# Patient Record
Sex: Female | Born: 1960 | Race: Black or African American | Hispanic: No | Smoking: Never smoker
Health system: Southern US, Community
[De-identification: ages and names within clinical notes are randomized; demographics above are authoritative.]

## PROBLEM LIST (undated history)

## (undated) DIAGNOSIS — E7800 Pure hypercholesterolemia, unspecified: Secondary | ICD-10-CM

## (undated) DIAGNOSIS — M47812 Spondylosis without myelopathy or radiculopathy, cervical region: Secondary | ICD-10-CM

## (undated) DIAGNOSIS — R569 Unspecified convulsions: Secondary | ICD-10-CM

## (undated) HISTORY — PX: HERNIA REPAIR: SHX51

## (undated) HISTORY — PX: ABDOMINAL HYSTERECTOMY: SHX81

## (undated) HISTORY — PX: CHOLECYSTECTOMY: SHX55

## (undated) HISTORY — PX: CARPAL TUNNEL RELEASE: SHX101

---

## 2004-05-30 ENCOUNTER — Encounter: Payer: Self-pay | Admitting: Gastroenterology

## 2004-07-11 ENCOUNTER — Encounter: Payer: Self-pay | Admitting: Gastroenterology

## 2006-10-27 ENCOUNTER — Encounter: Payer: Self-pay | Admitting: Gastroenterology

## 2006-11-18 ENCOUNTER — Ambulatory Visit (HOSPITAL_COMMUNITY): Admission: RE | Admit: 2006-11-18 | Discharge: 2006-11-18 | Payer: Self-pay | Admitting: Family Medicine

## 2007-04-29 ENCOUNTER — Ambulatory Visit: Payer: Self-pay | Admitting: Obstetrics & Gynecology

## 2007-05-06 ENCOUNTER — Ambulatory Visit (HOSPITAL_COMMUNITY): Admission: RE | Admit: 2007-05-06 | Discharge: 2007-05-06 | Payer: Self-pay | Admitting: Family Medicine

## 2007-05-15 ENCOUNTER — Encounter (INDEPENDENT_AMBULATORY_CARE_PROVIDER_SITE_OTHER): Payer: Self-pay | Admitting: *Deleted

## 2007-05-16 ENCOUNTER — Inpatient Hospital Stay (HOSPITAL_COMMUNITY): Admission: RE | Admit: 2007-05-16 | Discharge: 2007-05-17 | Payer: Self-pay | Admitting: Surgery

## 2007-05-22 ENCOUNTER — Emergency Department (HOSPITAL_COMMUNITY): Admission: EM | Admit: 2007-05-22 | Discharge: 2007-05-23 | Payer: Self-pay | Admitting: Emergency Medicine

## 2007-05-27 ENCOUNTER — Ambulatory Visit: Payer: Self-pay | Admitting: Obstetrics & Gynecology

## 2007-07-16 ENCOUNTER — Emergency Department (HOSPITAL_COMMUNITY): Admission: EM | Admit: 2007-07-16 | Discharge: 2007-07-16 | Payer: Self-pay | Admitting: Emergency Medicine

## 2007-07-19 ENCOUNTER — Emergency Department (HOSPITAL_COMMUNITY): Admission: EM | Admit: 2007-07-19 | Discharge: 2007-07-20 | Payer: Self-pay | Admitting: Emergency Medicine

## 2007-10-02 ENCOUNTER — Ambulatory Visit (HOSPITAL_BASED_OUTPATIENT_CLINIC_OR_DEPARTMENT_OTHER): Admission: RE | Admit: 2007-10-02 | Discharge: 2007-10-02 | Payer: Self-pay | Admitting: Orthopedic Surgery

## 2007-10-22 ENCOUNTER — Ambulatory Visit: Payer: Self-pay | Admitting: Obstetrics & Gynecology

## 2007-10-24 ENCOUNTER — Emergency Department (HOSPITAL_COMMUNITY): Admission: EM | Admit: 2007-10-24 | Discharge: 2007-10-24 | Payer: Self-pay | Admitting: *Deleted

## 2007-11-03 ENCOUNTER — Ambulatory Visit (HOSPITAL_COMMUNITY): Admission: RE | Admit: 2007-11-03 | Discharge: 2007-11-03 | Payer: Self-pay | Admitting: Obstetrics & Gynecology

## 2007-11-25 ENCOUNTER — Ambulatory Visit: Payer: Self-pay | Admitting: Obstetrics and Gynecology

## 2007-11-26 ENCOUNTER — Encounter: Admission: RE | Admit: 2007-11-26 | Discharge: 2007-12-10 | Payer: Self-pay | Admitting: Neurology

## 2008-04-06 ENCOUNTER — Emergency Department (HOSPITAL_COMMUNITY): Admission: EM | Admit: 2008-04-06 | Discharge: 2008-04-07 | Payer: Self-pay | Admitting: Emergency Medicine

## 2008-05-10 ENCOUNTER — Ambulatory Visit: Payer: Self-pay | Admitting: Gastroenterology

## 2008-05-10 DIAGNOSIS — K219 Gastro-esophageal reflux disease without esophagitis: Secondary | ICD-10-CM

## 2008-05-10 DIAGNOSIS — D509 Iron deficiency anemia, unspecified: Secondary | ICD-10-CM

## 2009-01-27 ENCOUNTER — Emergency Department (HOSPITAL_COMMUNITY): Admission: EM | Admit: 2009-01-27 | Discharge: 2009-01-27 | Payer: Self-pay | Admitting: Emergency Medicine

## 2009-05-08 ENCOUNTER — Encounter: Admission: RE | Admit: 2009-05-08 | Discharge: 2009-05-08 | Payer: Self-pay | Admitting: Obstetrics & Gynecology

## 2009-06-28 ENCOUNTER — Telehealth: Payer: Self-pay | Admitting: Gastroenterology

## 2009-07-06 ENCOUNTER — Encounter (INDEPENDENT_AMBULATORY_CARE_PROVIDER_SITE_OTHER): Payer: Self-pay | Admitting: *Deleted

## 2009-07-06 ENCOUNTER — Ambulatory Visit (HOSPITAL_COMMUNITY): Admission: RE | Admit: 2009-07-06 | Discharge: 2009-07-06 | Payer: Self-pay | Admitting: Obstetrics & Gynecology

## 2009-08-10 ENCOUNTER — Ambulatory Visit: Payer: Self-pay | Admitting: Gastroenterology

## 2009-08-29 ENCOUNTER — Telehealth: Payer: Self-pay | Admitting: Gastroenterology

## 2009-10-11 ENCOUNTER — Ambulatory Visit: Payer: Self-pay | Admitting: Gastroenterology

## 2009-10-11 DIAGNOSIS — R110 Nausea: Secondary | ICD-10-CM

## 2009-10-11 DIAGNOSIS — K5900 Constipation, unspecified: Secondary | ICD-10-CM | POA: Insufficient documentation

## 2009-10-17 ENCOUNTER — Telehealth (INDEPENDENT_AMBULATORY_CARE_PROVIDER_SITE_OTHER): Payer: Self-pay | Admitting: *Deleted

## 2009-10-23 ENCOUNTER — Encounter (INDEPENDENT_AMBULATORY_CARE_PROVIDER_SITE_OTHER): Payer: Self-pay

## 2010-04-01 ENCOUNTER — Encounter: Payer: Self-pay | Admitting: Internal Medicine

## 2010-04-02 ENCOUNTER — Encounter: Payer: Self-pay | Admitting: *Deleted

## 2010-04-10 NOTE — Progress Notes (Signed)
Summary: refills  Phone Note Call from Patient Call back at Home Phone [phone]   Caller: Patient Call For: Russella Dar Reason for Call: Refill Medication Summary of Call: Patient would like refill for her Nexium until her appt day 6-2. Jordan Hawks at [facility] [address]) Initial call taken by: Tawni Levy,  June 28, 2009 9:07 AM  Follow-up for Phone Call        Rx was sent to pts pharmacy and pt told to keep her appt for any further refills.  Follow-up by: Christie Nottingham CMA Duncan Dull),  June 28, 2009 9:14 AM    Prescriptions: NEXIUM 40 MG CPDR (ESOMEPRAZOLE MAGNESIUM) one tablet by mouth once daily...MUST HAVE OFFICE VISIT BEFORE MORE REFILLS  #30 x 1   Entered by:   Christie Nottingham CMA (AAMA)   Authorized by:   Meryl Dare MD [facility]   Signed by:   Christie Nottingham CMA (AAMA) on 06/28/2009   Method used:   [facility] to        [facility] (retail)       [address]       [place], [place]  [address]       Ph: [phone]       Fax: [phone]   RxID:   [id number]

## 2010-04-10 NOTE — Miscellaneous (Signed)
Summary: Recall colon  Clinical Lists Changes  Observations: Added new observation of COLONNXTDUE: 06/2012 (10/23/2009 9:36)

## 2010-04-10 NOTE — Progress Notes (Signed)
Summary: Nexium samples  Phone Note Call from Patient Call back at Home Phone [phone]   Caller: Patient Call For: Dr. Russella Dar Reason for Call: Talk to Nurse Summary of Call: pt has run out of Nexium and will not be seen in office until August to renew rx... would like samples Initial call taken by: Vallarie Mare,  August 29, 2009 9:39 AM  Follow-up for Phone Call        Left a message for regarding her Nexium samples and upcoming appt. Told pt that we cannot give her any samples or a refill until her upcoming appt in August b/c she no showed her last appt with [place] in June. I told her when I spoke with her in April she could not have any more refills until she came for her appt and she did not come for her appt in June nor did she call to reschedule her appt.  Follow-up by: Christie Nottingham CMA Duncan Dull),  August 29, 2009 10:24 AM

## 2010-04-10 NOTE — Procedures (Signed)
Summary: Colonoscopy/[facility]   Imported By: Sherian Rein 10/24/2009 14:53:53  _____________________________________________________________________  External Attachment:    Type:   Image     Comment:   External Document

## 2010-04-10 NOTE — Op Note (Signed)
Summary: Laparoscopy with lysis of adhesions    NAME:  Julie Fuller, Julie Fuller               ACCOUNT NO.:  [id number]      MEDICAL RECORD NO.:  [id number]          PATIENT TYPE:  AMB      LOCATION:  SDC                           [facility]:  [facility]      PHYSICIAN:  Darryl Nestle, MD     DATE OF BIRTH:  1960-07-26      DATE OF PROCEDURE:  07/06/2009   DATE OF DISCHARGE:                                  OPERATIVE REPORT      PREOPERATIVE DIAGNOSES:  Left adnexal mass, possible left ovarian cyst,   pelvic pain, and dyspareunia.      POSTOPERATIVE DIAGNOSES:  Pelvic adhesions.  No evidence of ovarian cyst   or adnexal cyst.      PROCEDURES:  Laparoscopy with lysis of adhesions in the pelvis, removal   of metal clips that were possibly used for her hernia mesh placement   that were noted in the lower pelvic area.      ANESTHESIA:  General endotracheal.      IV FLUIDS:  1700 mL LR.      URINE OUTPUT:  200 mL clear.      ESTIMATED BLOOD LOSS:  Minimal.      SURGEON:  Darryl Nestle, MD      ASSISTANT:  Genia Del, MD      COMPLICATIONS:  None.      PATHOLOGY:  None.      DISPOSITION:  The patient is stable, extubated, and to recovery room.      PROCEDURE IN DETAILS:  The patient is a 50 year old woman with left   pelvic pain intermittent nonradiating in the left lower quadrant.  She   has prior multiple abdominal surgeries and including hysterectomy.   Ultrasound and CT scan noted a 7-cm cyst in the left adnexa, suspected   left ovarian cyst.  The pain was, however, higher up in the lower   quadrant for it to be ovarian. Patient was seen by Gen surgeon to rule out    hernia related pain.  However due to high index of suspicion,   decision was made to proceed with laparoscopy diagnostic and possible   removal of ovarian cyst or ovary.  Risks and complications of surgery   including infection, bleeding, damage to surrounding structures   including ureters, bladder, bowel were  reviewed, risk of come off   laparotomy was reviewed.  The patient was understanding.  Informed   written consent was obtained.  She was brought to the operating room   with IV running.  She underwent general endotracheal anesthesia without   difficulty.  Then she was given dorsal lithotomy position, parts were   prepped and draped.  Foley catheter was placed and a sponge stick was   put in the vagina for manipulation.  Initially, an attempt was made to   enter the peritoneal cavity with open laparoscopy and Hasson cannula   through her umbilicus.  A 1-cm incision was made above her umbilicus,   but on palpation it was difficult to  see and feel the fascia and there was a lot   of thickened tissue palpated through the incision.  Hence, the decision   was made to proceed with left upper quadrant entry.  The patient was   given OG tube to empty her stomach contents.  A 5-mm incision was made   in the left upper quadrant about 3 cm above the line of the umbilicus   and in the midclavicular line.  The skin was lifted up with Allis clamps   and a long Veress needle was used.  Placement was confirmed with saline   drop.  A pneumoperitoneum was begun to obtain a pressure of 14 mm.   Veress was removed and while tenting the abdominal wall, again a long   trocar was used and a OptiVu technique was used to introduce the trocar   well visualizing its pass.  Once the peritoneum entry was confirmed, the   cannula was left in place, trocar was removed and the laparoscope was   reintroduced.  A 5-mm  0-degree laparoscope was used.  The patient was   then given Trendelenburg position.  There was difficult to see the   vaginal cuff due to bowel in the pelvic area.  There was no evidence of   adhesion in the lower abdomen along the anterior abdominal wall and   therefore decision was made to place 2 lateral ports in the lower right   and lower left quadrant under direct visualization, 5-mm incisions were    made and 5-mm trocar cannula was introduced without difficulty.  A blunt   probe and a grasper were introduced through each of these and bowel was   moved out of the pelvis.  Left adnexa and right adnexa were evaluated.   Left adnexa showed no evidence of cyst.  There was adhesions between the   left ovary and left tube and the vaginal wall as well as left ovary and   bowel, but there was no evidence of ovarian cyst or paratubal cyst.  The   patient was then made to free these adhesions up with monopolar scissors   with and without cautery, adhesiolysis was performed initially of the   ovary from the bowel and then the fimbria from the vaginal wall.  It was   also noted that in the right lower quadrant above the bladder and over   the vaginal cuff, there were metal coils were noted which were identical   to the ones that were used to incur hernia mesh.  These were removed by   grasping and pulling them out of the lower ports.  Survey of the rest of   the abdomen showed that there was some other bowel adhesions on the left   side, but it did not look like it was changing the axis of peristalsis   in the left lower quadrant.  In the right lower quadrant, the right   adnexa and bowels looked normal.  Both the tube showed evidence of small   paratubal cyst less than 1 cm in size and these did not need to be   removed.  The survey of upper abdomen by her umbilical hernia repairshowed presence of mesh.  There was also evidence of coils  that were   holding the mesh in place.  The bowel (large bowel) was noted to be   adherent to anterior abdominal wall under the umbilicus, but this was   not freed up due to dense adhesions.  Also the patient's pain is not   periumbilical and therefore probably not related to bowel peristalsis   axis.  Liver was noted to be normal.  The patient had prior   cholecystectomy.  Rest of the upper abdomen, right and left upper   quadrants were normal.  The trocars were  removed under visualization   after performing irrigation.  There was no active bleeding.  The   pneumoperitoneum was deflated.  At the umbilicus, a 0 Vicryl stitch was   taken in the scar tissue under the skin incision and skin was   approximated with 3-0 Vicryl and the three 5-mm ports entry skin   incisions were approximated with Dermabond.  The Foley catheter and   vaginal sponge stick were removed.  The patient was made supine.  She   was reversed from anesthesia and brought to the recovery room in stable   condition.  Photograph were taken for patient and will be discussed at a   followup visit.      The patient will be discharged home from recovery room and follow up   with me in 2 weeks.               Darryl Nestle, MD            VM/MEDQ  D:  07/06/2009  T:  07/06/2009  Job:  [id number]      Electronically Signed by Susa Day MODY  on 07/13/2009 11:20:11 AM

## 2010-04-10 NOTE — Procedures (Signed)
Summary: EGD/[facility]  EGD/[facility]   Imported By: Sherian Rein 10/24/2009 14:52:50  _____________________________________________________________________  External Attachment:    Type:   Image     Comment:   External Document

## 2010-04-10 NOTE — Assessment & Plan Note (Signed)
Summary: f/u and med refill--ch.   History of Present Illness Visit Type: Follow-up Visit Primary GI MD: Elie Goody MD [facility] Primary Provider: Elpidio Anis, PA  Requesting Provider: na Chief Complaint: F/u for GERD and pt c/o nausea History of Present Illness:   Mrs. Julie Fuller returns for followup of GERD which is under very good control on Nexium 40 mg daily. She complains of ongoing problems with constant nausea which does not change with meals or time of day. She also has ongoing constipation, which has improved with the addition of fiber supplements. We had not received prior records from endoscopy and colonoscopy in [place], [place].   GI Review of Systems    Reports nausea.      Denies abdominal pain, acid reflux, belching, bloating, chest pain, dysphagia with liquids, dysphagia with solids, heartburn, loss of appetite, vomiting, vomiting blood, weight loss, and  weight gain.      Reports constipation.     Denies anal fissure, black tarry stools, change in bowel habit, diarrhea, diverticulosis, fecal incontinence, heme positive stool, hemorrhoids, irritable bowel syndrome, jaundice, light color stool, liver problems, rectal bleeding, and  rectal pain.   Current Medications (verified): 1)  Alprazolam 0.5 Mg Tbdp (Alprazolam) .... Take 1/2-1 Tablet By Mouth As Needed 2)  Topiramate 100 Mg Tabs (Topiramate) .... Take 3 Tablets By Mouth At Bedtime 3)  Nexium 40 Mg Cpdr (Esomeprazole Magnesium) .... One Tablet By Mouth Once Daily 4)  Nortriptyline Hcl 25 Mg Caps (Nortriptyline Hcl) .... Four Tablets By Mouth At Bedtime 5)  Flexeril 10 Mg Tabs (Cyclobenzaprine Hcl) .... One Every Six Hours As Needed For Pain 6)  Pravastatin Sodium 20 Mg Tabs (Pravastatin Sodium) .... One Tablet By Mouth Once Daily 7)  Mobic 15 Mg Tabs (Meloxicam) .... One Tablet By Mouth Once Daily 8)  Ventolin Hfa 108 (90 Base) Mcg/act Aers (Albuterol Sulfate) .... Two Times A Day As Needed  Allergies (verified): 1)   ! Dilantin 2)  ! Erythromycin  Past History:  Past Medical History: Seizure disorder Fe deficiency anemia Anxiety Disorder Arthritis Asthma Chronic Headaches Depression GERD, 1986 Hiatal hernia  Hyperlipidemia Kidney Stones Obesity Pneumonia Urinary Tract Infection  Past Surgical History: Cholecystectomy, 1994 Umbilical hernia repairs, 1994, 1999 Ventral hernia repair, 05/2007 Hysterectomy, 1999 carpal tunnel release-bilateral bunionectomy-bilateral pin removal-left foot Laparoscopy, pelvic adhesion lysis, 2011  Family History: Reviewed history from 05/10/2008 and no changes required. No FH of Colon Cancer: Family History of Diabetes: Mother, Sister x 2 Family History of Heart Disease: Father Family History of Irritable Bowel Syndrome: Sister Family History of Liver Disease/Cirrhosis: Brother (alcoholic cirrhosis)  Social History: [employer] [place] [employer]  Patient has never smoked.  Alcohol Use - no Illicit Drug Use - no Patient gets regular exercise.  Review of Systems       The pertinent positives and negatives are noted as above and in the HPI. All other ROS were reviewed and were negative.   Vital Signs:  Patient profile:   50 year old female Height:      69 inches Weight:      268 pounds BMI:     39.72 BSA:     2.34 Pulse rate:   88 / minute Pulse rhythm:   regular BP sitting:   122 / 76  (left arm) Cuff size:   large  Vitals Entered By: Ok Anis CMA (October 11, 2009 9:40 AM)  Physical Exam  General:  Well developed, well nourished, no acute distress. obese.  Head:  Normocephalic and atraumatic. Eyes:  PERRLA, no icterus. Mouth:  No deformity or lesions, dentition normal. Lungs:  Clear throughout to auscultation. Heart:  Regular rate and rhythm; no murmurs, rubs,  or bruits. Abdomen:  Soft, nontender and nondistended. No masses, hepatosplenomegaly or hernias noted. Normal bowel sounds. Psych:  Alert and cooperative. Normal mood and  affect.  Impression & Recommendations:  Problem # 1:  GERD (ICD-530.81) Continue Nexium 40 mg q.a.m. and standard antireflux measures. Request records from prior endoscopy.  Problem # 2:  CONSTIPATION (ICD-564.00) Chronic constipation. Request records from prior colonoscopy. Daily fiber supplements and a long-term high fiber diet with increased daily water intake.  Problem # 3:  NAUSEA (ICD-787.02) Chronic nausea that does not vary with meals or time of day. Medication side effects, and nongastrointestinal causes need to be further evaluated. She will discuss with her PCP about actually modifying her medication regimen.  Patient Instructions: 1)  Nexium has been sent to your pharmacy.  2)  High Fiber, Low Fat  Healthy Eating Plan brochure given.  3)  Please continue current medications.  4)  Please schedule a follow-up appointment in 1 year. 5)  Copy sent to : Elpidio Anis, PA 6)  The medication list was reviewed and reconciled.  All changed / newly prescribed medications were explained.  A complete medication list was provided to the patient / caregiver.  Prescriptions: NEXIUM 40 MG CPDR (ESOMEPRAZOLE MAGNESIUM) one tablet by mouth once daily  #30 x 11   Entered by:   Christie Nottingham CMA (AAMA)   Authorized by:   Meryl Dare MD [facility]   Signed by:   Meryl Dare MD [facility] on 10/11/2009   Method used:   Electronically to        [facility] (retail)       [address]       [place], [place]  [address]       Ph: [phone]       Fax: [phone]   RxID:   [id number]

## 2010-04-10 NOTE — Progress Notes (Signed)
  Phone Note Other Incoming   Request: Send information Action Taken: Information Sent Summary of Call: Records received from [facility]. 46 pages forwarded to Dr. Russella Dar for review.

## 2010-05-29 LAB — CBC
HCT: 37.3 % (ref 36.0–46.0)
MCHC: 33.6 g/dL (ref 30.0–36.0)
RBC: 4.51 MIL/uL (ref 3.87–5.11)
RDW: 14.6 % (ref 11.5–15.5)
WBC: 5.2 10*3/uL (ref 4.0–10.5)

## 2010-07-24 NOTE — Group Therapy Note (Signed)
NAME:  Julie Fuller, Julie Fuller NO.:  [id number]   MEDICAL RECORD NO.:  [id number]          PATIENT TYPE:  WOC   LOCATION:  [facility]                   FACILITY:  [facility]   PHYSICIAN:  Elsie Lincoln, MD      DATE OF BIRTH:  December 08, 1960   DATE OF SERVICE:  10/22/2007                                  CLINIC NOTE   The patient is a 50 year old female, The patient presents with burning  at the urethra continuously and right mid quadrant pain while laying on  the right side.  Of note, the patient has had three hernia repairs.  There could be some adhesions with this.  She also had hysterectomy, but  does have her ovaries.  She has a complaint of dysuria, but mostly just  burning from the urethra at all times.  She denies any discharge.  She  is sexually active.   PHYSICAL EXAMINATION:  BACK:  No CVA tenderness.  ABDOMEN:  Obese.  Well-healed incisions from hernia repairs.  Nontender,  nondistended.  No rebound or guarding.  GENITALIA:  Tanner V.  Vagina pink, vault intact, small cystocele.  No  tenderness over urethra or bladder.  Vulva is burning at the urethra,  but no vestibular burning or pain.   ASSESSMENT/PLAN:  A 50 year old female with burning of the urethra and  right sided pain.   1. Urinalysis.  2. Urine culture.  3. Transvaginal ultrasound.  4. The patient wants to be tested for hep and hep B.  She has been      tested for HIV and syphilis, which were negative.  The patient will      come back in 2-3 weeks for results.           ______________________________  Elsie Lincoln, MD     KL/MEDQ  D:  10/22/2007  T:  10/22/2007  Job:  [id number]

## 2010-07-24 NOTE — Op Note (Signed)
NAME:  Julie Fuller, Julie Fuller NO.:  [id number]   MEDICAL RECORD NO.:  [id number]          PATIENT TYPE:  INP   LOCATION:  1611                         FACILITY:  [facility]   PHYSICIAN:  Wilmon Arms. Corliss Skains, M.D. DATE OF BIRTH:  1960-04-20   DATE OF PROCEDURE:  05/15/2007  DATE OF DISCHARGE:  05/17/2007                               OPERATIVE REPORT   PREOPERATIVE DIAGNOSIS:  Supraumbilical ventral hernia.   POSTOPERATIVE DIAGNOSIS:  Supraumbilical ventral hernia.   PROCEDURE PERFORMED:  Laparoscopic ventral hernia repair with mesh.   SURGEON:  Wilmon Arms. Corliss Skains, M.D., FACS   ANESTHESIA:  General endotracheal.   INDICATIONS:  The patient is a 50 year old female who has had two  previous umbilical hernia repairs, the second one with mesh.  Over the  last year and a half she has developed a bulge in her epigastric region.  A CT scan last year confirmed that she had a ventral hernia containing  fat.  In the intervening time this has become much larger and she has  had a lot of discomfort.  The hernia is not reducible.   DESCRIPTION OF PROCEDURE:  The patient was brought to the operating room  and placed in the supine position on the operating room table.  After an  adequate level of general anesthesia was obtained, a Foley catheter was  placed under sterile technique.  The patient's abdomen was prepped with  Betadine and draped in sterile fashion.  Time-out was taken to assure  proper patient and proper procedure.  We anesthetized an area below the  left costal margin and anterior axillary line with 25% Marcaine.  A 5 mm  incision was made here.  I attempted to use a 5 mm 75 mm length OptiVu  trocar to cannulate the peritoneal cavity.  However, the patient's body  habitus prevented [place] from actually reaching the peritoneal cavity.  We  attempted using a 100 mm length 5 mm trocar and we were still unable to  safely cannulate the peritoneal cavity.  Therefore at the level of the  umbilicus in the anterior axillary line, we made an 11 mm transverse  incision and entered the peritoneal cavity using a open Hassan  technique.  A stay suture of 0 Vicryl was placed around the fascial  opening.  The Hasson cannula was inserted and secured with a stay  suture.  Pneumoperitoneum was obtained by insufflating CO2 maintaining  maximal pressure of 15 mmHg.  The laparoscope was inserted and we  examined the left upper quadrant where we had attempted to place the  Optiview trocar.  There were no defects in the peritoneum, so we never  actually reached peritoneal cavity.  There was no sign of any bleeding  or hematoma.  Now, under direct vision, we were able to place the 100 mm  5 mm trocar into the peritoneal cavity.  Another 5 mm trocar was placed  in the left lower quadrant.  There was a large amount of omentum that  was herniated up into the hernia sac.  There were also some adhesions  from the patient's previous  hernia repair.  These adhesions were taken  down with the Harmonic scalpel.  We were able to reduce the large amount  of omentum back into the peritoneal space and take down all the  adhesions exposing the defect.  The defect was in the midline and  measured about 3 cm in diameter just at the upper edge of her previous  hernia repair.  We carefully inspected the previous hernia repair.  Some  of the sutures seemed to be rather tenuous.  Therefore the decision was  made to cover both the new defect as well as the old repair with a sheet  of Proceed mesh.  We selected a 15 x 20 cm sheet of Proceed.  Six stay  sutures of zero prior Prolene were placed around the edges of the mesh.  The mesh was rolled up and inserted through the Hasson cannula.  It was  unfurled.  The Endoclose device was used to pull up the stay sutures  through small stab incisions.  The mesh was then suspended from the  posterior surface of the anterior abdominal wall.  The stay sutures were  then  tied down.  The ProTack device was then used to place a ring of  tacks circumferentially around the mesh.  We placed an additional 5 mm  trocar on the patient's right side to allow proper visualization of the  tack placement.  Several random tacks were placed inside the outer ring  of ProTacks.  The mesh did not appear to be under any undue tension.  There was no redundancy and all edges of the mesh seemed to be tightly  attached to the anterior abdominal wall.  We then removed the Hasson  cannula and closed the fascia with zero Vicryl stay suture.  Pneumoperitoneum was then released as the trocars were removed.  The  wounds were closed with deep dermal sutures of 4-0 Monocryl.  Dermabond  was used to close the skin.  A Foley catheter was removed.  The patient  was then extubated and brought to recovery in stable condition.  All  sponge, instrument and needle counts were correct.      Wilmon Arms. Tsuei, M.D.  Electronically Signed     MKT/MEDQ  D:  05/15/2007  T:  05/17/2007  Job:  [id number]

## 2010-07-24 NOTE — Op Note (Signed)
NAME:  Julie Fuller, Julie Fuller               ACCOUNT NO.:  [id number]   MEDICAL RECORD NO.:  [id number]          PATIENT TYPE:  AMB   LOCATION:  DSC                          FACILITY:  [facility]   PHYSICIAN:  Cindee Salt, M.D.       DATE OF BIRTH:  26-Jul-1960   DATE OF PROCEDURE:  10/02/2007  DATE OF DISCHARGE:  05/06/2007                               OPERATIVE REPORT   PREOPERATIVE DIAGNOSIS:  Carpal tunnel syndrome left hand.   POSTOPERATIVE DIAGNOSIS:  Carpal tunnel syndrome left hand.   OPERATION:  Decompression left median nerve.   SURGEON:  Cindee Salt, MD   ANESTHESIA:  Forearm based IV regional.   ANESTHESIOLOGIST:  W. Autumn Patty, MD   HISTORY:  The patient is a 50 year old female with a history of carpal  tunnel syndrome.  EMG nerve conduction is positive.  This has not  responded to conservative treatment.  She has elected to undergo  decompression of the median nerve.  Preoperative, perioperative, and  postoperative course have been discussed along with the risks and  complications.  She is aware that there is no guarantee with the  surgery, possibility of infection, recurrence, injury to arteries,  nerves, tendons, incomplete relief of symptoms, and dystrophy.  The  preoperative area, the patient is seen.  The extremity marked by both  the patient and surgeon.  Antibiotic given.   PROCEDURE:  The patient is brought to the operating room, where a  forearm based IV regional anesthetic was carried out without difficulty.  She was prepped using DuraPrep in the supine position with the left arm  free.  A time-out was taken.  A longitudinal incision was made in the  palm and carried down through the subcutaneous tissue.  Bleeders were  electrocauterized.  Palmar fascia was split.  Superficial palmar arch  identified.  The flexor tendon to the ring little finger identified to  the ulnar side of the median nerve, a carpal retinaculum was incised  with sharp dissection.  A right  angle and Sewall retractor were placed  between the skin and the forearm fascia.  The fascia was released for  approximately 1.5 centimeter proximal to the wrist crease under direct  vision.  The canal was explored.  Area of compression to the nerve was  apparent.  No further lesions were identified.  The wound was irrigated.  The skin was closed with interrupted 5-0 Vicryl Rapide sutures.  A  sterile compressive dressing and splint to the wrist was applied.  The  patient tolerated the procedure well and was taken to the recovery room  for observation in satisfactory condition.  She will be discharged home  to return to the [facility] of [place] in 1 week, on Vicodin.          ______________________________  Cindee Salt, M.D.    GK/MEDQ  D:  10/02/2007  T:  10/03/2007  Job:  [id number]   cc:   Dr. Anne Hahn

## 2010-07-24 NOTE — Discharge Summary (Signed)
NAME:  SKILA, ROLLINS NO.:  [id number]   MEDICAL RECORD NO.:  [id number]          PATIENT TYPE:  WOC   LOCATION:  WOC                          FACILITY:  [facility]   PHYSICIAN:  Lesly Dukes, M.D. DATE OF BIRTH:  06-14-1960   DATE OF ADMISSION:  04/29/2007  DATE OF DISCHARGE:                               DISCHARGE SUMMARY   HISTORY OF PRESENT ILLNESS:  This is a 50 year old female with a history  of hysterectomy and two umbilical hernia repairs with a long history of  urinary problems.  She comes to clinic complaining of a six to seven-  month history of increase in urinary frequency, lower abdominal pressure  and incomplete bladder emptying sensation.  Approximately at the same  time, she has had some vaginal bleeding after intercourse with pink  froth noted on toilet paper approximately 50% of the time with  intercourse approximately one time per week.  She denies dysuria,  hematuria.  She does state she has a clear vaginal discharge for the  past few months with no smell.  She denies fever, chills, or vomiting.  She does complain of an occasional nausea which is secondary to a  ventral hernia.   PAST MEDICAL HISTORY:  1. Migraines.  2. Seizures.  She has been off medications for seven years.  She was      on Depakote.  3. Anemia.  4. Lower back pain.  5. Knee arthritis.  6. Ventral hernia.  7. Hiatal hernia.   PAST SURGICAL HISTORY:  1. Hysterectomy in 2004.  2. Cystoscopy in 2004.  3. Cholecystectomy in 2000.  4. Two umbilical hernia repairs, both in 2000.  5. She had a bunionectomy in 2007.   FAMILY HISTORY:  She has a family history of sickle-cell.  Her son is  deceased at age 11 of malignant melanoma.  Her mother and both of her  sisters are non-insulin dependent diabetics, and her father has coronary  artery disease.   SOCIAL HISTORY:  Denies  tobacco, alcohol or drugs.   MEDICATIONS:  1. Topamax.  2. Relafen.  3. Mobic.  4.  Albuterol.  5. Flexeril.   OB HISTORY:  She is a P3, G3.  She has had abnormal Pap smears in the  past, none since her hysterectomy.  She was treated for bacterial  vaginosis with antibiotics in 2007.  She has never needed any procedures  to treat her abnormal Pap smears.   PHYSICAL EXAMINATION:  HEENT:  Pupils were equal, round, reactive to  light.  EOMI.  The oropharynx is clear.  CARDIAC:  Regular rate and rhythm.  No gallops, rubs or murmurs.  LUNGS:  Clear to auscultation bilaterally.  ABDOMEN:  Obese, positive bowel sounds, soft, no organomegaly, moderate-  sized reducible ventral hernia.  PELVIC EXAM:  No cervix, no adnexal tenderness.  There is a cystocele  present.  It is a well-compensated vaginal musculature wall.  EXTREMITIES:  No edema.   Samples were taken for GC, chlamydia as well as wet prep.  Post void  residual was 20 ml.   ASSESSMENT/PLAN:  This  is a 50 year old woman with a long history of  urinary problems with a six to seven-month history of increase in  dysfunction.  We will be getting her old pathology reports from her  hysterectomy, doing a GC and chlamydia, doing urinalysis.  We will be  sending a urology consult.     ______________________________  Dory Peru, M.D.  Electronically Signed    FH/MEDQ  D:  04/29/2007  T:  04/30/2007  Job:  [id number]

## 2010-09-02 ENCOUNTER — Emergency Department (HOSPITAL_COMMUNITY)
Admission: EM | Admit: 2010-09-02 | Discharge: 2010-09-02 | Disposition: A | Discharge status: Home / Self Care | Payer: PRIVATE HEALTH INSURANCE | Attending: Emergency Medicine | Admitting: Emergency Medicine

## 2010-09-02 DIAGNOSIS — K5289 Other specified noninfective gastroenteritis and colitis: Secondary | ICD-10-CM | POA: Insufficient documentation

## 2010-09-02 DIAGNOSIS — J45909 Unspecified asthma, uncomplicated: Secondary | ICD-10-CM | POA: Insufficient documentation

## 2010-09-02 DIAGNOSIS — Z87442 Personal history of urinary calculi: Secondary | ICD-10-CM | POA: Insufficient documentation

## 2010-09-02 DIAGNOSIS — R51 Headache: Secondary | ICD-10-CM | POA: Insufficient documentation

## 2010-09-02 DIAGNOSIS — R197 Diarrhea, unspecified: Secondary | ICD-10-CM | POA: Insufficient documentation

## 2010-09-02 DIAGNOSIS — R112 Nausea with vomiting, unspecified: Secondary | ICD-10-CM | POA: Insufficient documentation

## 2010-09-02 LAB — COMPREHENSIVE METABOLIC PANEL
BUN: 8 mg/dL (ref 6–23)
CO2: 24 mEq/L (ref 19–32)
Calcium: 9.6 mg/dL (ref 8.4–10.5)
Chloride: 104 mEq/L (ref 96–112)
GFR calc Af Amer: >60 mL/min (ref >60)
Glucose, Bld: 112 mg/dL — ABNORMAL HIGH (ref 70–99)
Potassium: 3.4 mEq/L — ABNORMAL LOW (ref 3.5–5.1)
Total Bilirubin: 0.2 mg/dL — ABNORMAL LOW (ref 0.3–1.2)

## 2010-09-02 LAB — CBC
HCT: 41.1 % (ref 36.0–46.0)
Hemoglobin: 13.4 g/dL (ref 12.0–15.0)
MCV: 81.9 fL (ref 78.0–100.0)
WBC: 7.6 10*3/uL (ref 4.0–10.5)

## 2010-09-02 LAB — URINALYSIS, ROUTINE W REFLEX MICROSCOPIC
Glucose, UA: NEGATIVE mg/dL
Hgb urine dipstick: NEGATIVE
Nitrite: NEGATIVE
Protein, ur: NEGATIVE mg/dL
Urobilinogen, UA: 0.2 mg/dL (ref 0.0–1.0)

## 2010-09-02 LAB — DIFFERENTIAL
Basophils Relative: 0 % (ref 0–1)
Eosinophils Relative: 0 % (ref 0–5)
Lymphs Abs: 1.5 10*3/uL (ref 0.7–4.0)

## 2010-09-11 ENCOUNTER — Other Ambulatory Visit (HOSPITAL_COMMUNITY): Payer: Self-pay

## 2010-09-12 ENCOUNTER — Emergency Department (HOSPITAL_BASED_OUTPATIENT_CLINIC_OR_DEPARTMENT_OTHER)
Admission: EM | Admit: 2010-09-12 | Discharge: 2010-09-12 | Disposition: A | Discharge status: Home / Self Care | Payer: PRIVATE HEALTH INSURANCE | Attending: Emergency Medicine | Admitting: Emergency Medicine

## 2010-09-12 DIAGNOSIS — S61209A Unspecified open wound of unspecified finger without damage to nail, initial encounter: Secondary | ICD-10-CM | POA: Insufficient documentation

## 2010-09-12 DIAGNOSIS — Y92009 Unspecified place in unspecified non-institutional (private) residence as the place of occurrence of the external cause: Secondary | ICD-10-CM | POA: Insufficient documentation

## 2010-09-12 DIAGNOSIS — W268XXA Contact with other sharp object(s), not elsewhere classified, initial encounter: Secondary | ICD-10-CM | POA: Insufficient documentation

## 2010-09-12 DIAGNOSIS — J45909 Unspecified asthma, uncomplicated: Secondary | ICD-10-CM | POA: Insufficient documentation

## 2010-09-12 DIAGNOSIS — E785 Hyperlipidemia, unspecified: Secondary | ICD-10-CM | POA: Insufficient documentation

## 2010-09-12 DIAGNOSIS — Z79899 Other long term (current) drug therapy: Secondary | ICD-10-CM | POA: Insufficient documentation

## 2010-09-12 DIAGNOSIS — G8929 Other chronic pain: Secondary | ICD-10-CM | POA: Insufficient documentation

## 2010-11-19 ENCOUNTER — Encounter: Payer: Self-pay | Admitting: Student

## 2010-11-19 ENCOUNTER — Other Ambulatory Visit: Payer: Self-pay

## 2010-11-19 ENCOUNTER — Emergency Department (INDEPENDENT_AMBULATORY_CARE_PROVIDER_SITE_OTHER): Payer: PRIVATE HEALTH INSURANCE

## 2010-11-19 ENCOUNTER — Emergency Department (HOSPITAL_BASED_OUTPATIENT_CLINIC_OR_DEPARTMENT_OTHER)
Admission: EM | Admit: 2010-11-19 | Discharge: 2010-11-19 | Disposition: A | Discharge status: Home / Self Care | Payer: PRIVATE HEALTH INSURANCE | Attending: Emergency Medicine | Admitting: Emergency Medicine

## 2010-11-19 DIAGNOSIS — R079 Chest pain, unspecified: Secondary | ICD-10-CM | POA: Insufficient documentation

## 2010-11-19 DIAGNOSIS — E7800 Pure hypercholesterolemia, unspecified: Secondary | ICD-10-CM | POA: Insufficient documentation

## 2010-11-19 DIAGNOSIS — J45909 Unspecified asthma, uncomplicated: Secondary | ICD-10-CM | POA: Insufficient documentation

## 2010-11-19 DIAGNOSIS — R0602 Shortness of breath: Secondary | ICD-10-CM

## 2010-11-19 DIAGNOSIS — M542 Cervicalgia: Secondary | ICD-10-CM

## 2010-11-19 DIAGNOSIS — R209 Unspecified disturbances of skin sensation: Secondary | ICD-10-CM

## 2010-11-19 DIAGNOSIS — M62838 Other muscle spasm: Secondary | ICD-10-CM | POA: Insufficient documentation

## 2010-11-19 HISTORY — DX: Spondylosis without myelopathy or radiculopathy, cervical region: M47.812

## 2010-11-19 HISTORY — DX: Pure hypercholesterolemia, unspecified: E78.00

## 2010-11-19 LAB — BASIC METABOLIC PANEL
BUN: 12 mg/dL (ref 6–23)
Chloride: 102 mEq/L (ref 96–112)
Creatinine, Ser: 0.6 mg/dL (ref 0.50–1.10)
GFR calc Af Amer: >60 mL/min (ref >60)
Glucose, Bld: 95 mg/dL (ref 70–99)
Potassium: 3.7 mEq/L (ref 3.5–5.1)

## 2010-11-19 LAB — CBC
HCT: 38.6 % (ref 36.0–46.0)
Hemoglobin: 13.1 g/dL (ref 12.0–15.0)
MCV: 79.6 fL (ref 78.0–100.0)
RDW: 14.1 % (ref 11.5–15.5)
WBC: 6.9 10*3/uL (ref 4.0–10.5)

## 2010-11-19 LAB — CARDIAC PANEL(CRET KIN+CKTOT+MB+TROPI)
Relative Index: 1.4 (ref 0.0–2.5)
Troponin I: <0.3 ng/mL (ref <0.30)

## 2010-11-19 MED ORDER — ASPIRIN 81 MG PO CHEW
324.0000 mg | CHEWABLE_TABLET | Freq: Once | ORAL | Status: AC
  Administered 2010-11-19: 324 mg via ORAL

## 2010-11-19 MED ORDER — ASPIRIN 81 MG PO CHEW
CHEWABLE_TABLET | ORAL | Status: AC
  Filled 2010-11-19: qty 4

## 2010-11-19 MED ORDER — CYCLOBENZAPRINE HCL 10 MG PO TABS: 10.0000 mg | ORAL_TABLET | Freq: Two times a day (BID) | ORAL | Status: AC | PRN

## 2010-11-19 MED ORDER — NITROGLYCERIN 0.4 MG SL SUBL
0.4000 mg | SUBLINGUAL_TABLET | SUBLINGUAL | Status: DC | PRN
  Filled 2010-11-19: qty 25

## 2010-11-19 MED ORDER — KETOROLAC TROMETHAMINE 30 MG/ML IJ SOLN
30.0000 mg | Freq: Once | INTRAMUSCULAR | Status: AC
  Administered 2010-11-19: 30 mg via INTRAVENOUS
  Filled 2010-11-19: qty 1

## 2010-11-19 MED ORDER — ASPIRIN 325 MG PO TABS: 325.0000 mg | ORAL_TABLET | ORAL | Status: DC

## 2010-11-19 NOTE — ED Provider Notes (Signed)
History     CSN: [id number] Arrival date & time: 11/19/2010  9:58 AM  Chief Complaint  Patient presents with  . Chest Pain    left side chest pressure radiating to left shoulder   HPI Pt reports for the last several days she has had moderate aching pain in L shoulder, worse with movement. Today she noticed some pain radiating into her arm and L upper chest, again worse with movement. No SOB, no nausea. Symptoms worse with turning head as well. No CAD or PE risk factors.   Past Medical History  Diagnosis Date  . Asthma   . Hypercholesteremia   . Migraine   . DJD (degenerative joint disease), cervical     Past Surgical History  Procedure Date  . Cholecystectomy   . Abdominal hysterectomy   . Hernia repair   . Carpal tunnel release     History reviewed. No pertinent family history.  History  Substance Use Topics  . Smoking status: Never Smoker   . Smokeless tobacco: Not on file  . Alcohol Use: No    OB History    Grav Para Term Preterm Abortions TAB SAB Ect Mult Living                  Review of Systems All other systems reviewed and are negative except as noted in HPI.   Physical Exam  BP 110/68  Pulse 106  Temp(Src) 98.2 F (36.8 C) (Oral)  Resp 20  SpO2 97%  Physical Exam  Nursing note and vitals reviewed. Constitutional: She is oriented to person, place, and time. She appears well-developed and well-nourished.  HENT:  Head: Normocephalic and atraumatic.  Eyes: EOM are normal. Pupils are equal, round, and reactive to light.  Neck: Normal range of motion. Neck supple.  Cardiovascular: Normal rate, normal heart sounds and intact distal pulses.   Pulmonary/Chest: Effort normal and breath sounds normal.  Abdominal: Bowel sounds are normal. She exhibits no distension. There is no tenderness.  Musculoskeletal: Normal range of motion. She exhibits tenderness. She exhibits no edema.       Tender over the muscles of the L shoulder/trapezius  Neurological: She  is alert and oriented to person, place, and time. She has normal strength. No cranial nerve deficit or sensory deficit.  Skin: Skin is warm and dry. No rash noted.  Psychiatric: She has a normal mood and affect.    ED Course  Procedures  MDM  Date: 11/19/2010  Rate: 104  Rhythm: sinus tachycardia  QRS Axis: normal  Intervals: normal  ST/T Wave abnormalities: normal  Conduction Disutrbances:none  Narrative Interpretation:   Old EKG Reviewed: unchanged from 01/27/2009   1:05 PM Pt's pain is resolved. Feeling much better. Doubt cardiac etiology of her pain, symptoms are clearly musculoskeletal . Pt ready to go home.      Charles B. Bernette Mayers, MD 11/19/10 1306

## 2010-11-19 NOTE — ED Notes (Signed)
Pt reports sudden onset of left chest wall pressure and tightness while ambulating today. Reports slight SOB and diaphoresis with onset. Pt reports pressure radiating to left shoulder.

## 2010-11-30 LAB — POCT URINALYSIS DIP (DEVICE)
Bilirubin Urine: NEGATIVE
Glucose, UA: NEGATIVE
Ketones, ur: NEGATIVE
Nitrite: NEGATIVE
Operator id: 148111

## 2010-12-03 LAB — CBC
HCT: 35.2 — ABNORMAL LOW
Hemoglobin: 12
MCHC: 34
MCV: 79.6
MCV: 80.3
Platelets: 277
Platelets: 298
RBC: 4.39
WBC: 5.4
WBC: 5.5

## 2010-12-03 LAB — DIFFERENTIAL
Eosinophils Relative: 0
Eosinophils Relative: 0
Lymphocytes Relative: 35
Lymphocytes Relative: 39
Lymphs Abs: 1.9
Lymphs Abs: 2.1
Monocytes Absolute: 0.4
Monocytes Relative: 6
Monocytes Relative: 7

## 2010-12-03 LAB — URINALYSIS, ROUTINE W REFLEX MICROSCOPIC
Glucose, UA: NEGATIVE
Ketones, ur: NEGATIVE
pH: 5.5

## 2010-12-03 LAB — URINE MICROSCOPIC-ADD ON

## 2010-12-03 LAB — COMPREHENSIVE METABOLIC PANEL
AST: 17
Albumin: 3.3 — ABNORMAL LOW
Calcium: 9.2
Creatinine, Ser: 0.91
GFR calc Af Amer: >60
Total Protein: 6.1

## 2010-12-03 LAB — BASIC METABOLIC PANEL
BUN: 11
Chloride: 110
GFR calc Af Amer: >60
GFR calc non Af Amer: >60
Potassium: 3.5
Sodium: 142

## 2010-12-07 LAB — POCT URINALYSIS DIP (DEVICE)
Bilirubin Urine: NEGATIVE
Hgb urine dipstick: NEGATIVE
Ketones, ur: NEGATIVE
Specific Gravity, Urine: 1.01
pH: 5.5

## 2010-12-07 LAB — POCT HEMOGLOBIN-HEMACUE: Hemoglobin: 12.8

## 2011-02-05 ENCOUNTER — Emergency Department (HOSPITAL_COMMUNITY)
Admission: EM | Admit: 2011-02-05 | Discharge: 2011-02-05 | Disposition: A | Discharge status: Home / Self Care | Payer: Self-pay | Attending: Emergency Medicine | Admitting: Emergency Medicine

## 2011-02-05 ENCOUNTER — Encounter (HOSPITAL_COMMUNITY): Payer: Self-pay

## 2011-02-05 DIAGNOSIS — R51 Headache: Secondary | ICD-10-CM | POA: Insufficient documentation

## 2011-02-05 DIAGNOSIS — R110 Nausea: Secondary | ICD-10-CM | POA: Insufficient documentation

## 2011-02-05 MED ORDER — NAPROXEN 500 MG PO TABS: 500.0000 mg | ORAL_TABLET | Freq: Two times a day (BID) | ORAL | Status: DC

## 2011-02-05 MED ORDER — HYDROMORPHONE HCL PF 2 MG/ML IJ SOLN
2.0000 mg | Freq: Once | INTRAMUSCULAR | Status: AC
  Administered 2011-02-05: 2 mg via INTRAMUSCULAR
  Filled 2011-02-05: qty 1

## 2011-02-05 MED ORDER — ONDANSETRON 8 MG PO TBDP
8.0000 mg | ORAL_TABLET | ORAL | Status: AC
  Administered 2011-02-05: 8 mg via ORAL
  Filled 2011-02-05 (×2): qty 1

## 2011-02-05 MED ORDER — ONDANSETRON HCL 4 MG PO TABS
8.0000 mg | ORAL_TABLET | Freq: Once | ORAL | Status: DC
  Filled 2011-02-05: qty 2

## 2011-02-05 MED ORDER — ONDANSETRON HCL 4 MG PO TABS: 8.0000 mg | ORAL_TABLET | Freq: Three times a day (TID) | ORAL | Status: AC | PRN

## 2011-02-05 MED ORDER — KETOROLAC TROMETHAMINE 60 MG/2ML IM SOLN
60.0000 mg | Freq: Once | INTRAMUSCULAR | Status: AC
  Administered 2011-02-05: 60 mg via INTRAMUSCULAR
  Filled 2011-02-05: qty 2

## 2011-02-05 NOTE — ED Notes (Signed)
Pt complains of a headache over her left eye, also states that she's been a little short of breath but has an inhaler that helps her

## 2011-02-05 NOTE — ED Provider Notes (Signed)
History     CSN: [id number] Arrival date & time: 02/05/2011  4:20 AM   First MD Initiated Contact with Patient 02/05/11 [phone]      Chief Complaint  Patient presents with  . Headache    (Consider location/radiation/quality/duration/timing/severity/associated sxs/prior treatment) The history is provided by the patient.  reports HA for several days. Worsening today. Worse with light. Nauseated. No vomiting. No fevers or chills. No diarrhea. Feels different than her typical migraines. No trauma. Has not tried any medicine at home. Also reports mild SOB that improves with inhaler as an unrelated complaint. Pain is moderate. constant  Past Medical History  Diagnosis Date  . Asthma   . Hypercholesteremia   . Migraine   . DJD (degenerative joint disease), cervical     Past Surgical History  Procedure Date  . Cholecystectomy   . Abdominal hysterectomy   . Hernia repair   . Carpal tunnel release     History reviewed. No pertinent family history.  History  Substance Use Topics  . Smoking status: Never Smoker   . Smokeless tobacco: Not on file  . Alcohol Use: No    OB History    Grav Para Term Preterm Abortions TAB SAB Ect Mult Living                  Review of Systems  Neurological: Positive for headaches.  All other systems reviewed and are negative.    Allergies  Erythromycin and Phenytoin  Home Medications   Current Outpatient Rx  Name Route Sig Dispense Refill  . ALBUTEROL IN Inhalation Inhale into the lungs.      Marland Kitchen NEXIUM PO Oral Take by mouth.      . NORTRIPTYLINE HCL PO Oral Take by mouth.      Marland Kitchen PRAVASTATIN SODIUM PO Oral Take by mouth.      . TOPAMAX PO Oral Take by mouth.        BP 133/71  Pulse 86  Temp(Src) 99 F (37.2 C) (Oral)  Resp 20  SpO2 97%  Physical Exam  Nursing note and vitals reviewed. Constitutional: She is oriented to person, place, and time. She appears well-developed and well-nourished.  HENT:  Head: Normocephalic and  atraumatic.  Eyes: Pupils are equal, round, and reactive to light.  Cardiovascular: Regular rhythm.   Pulmonary/Chest: Effort normal.  Abdominal: Soft.  Neurological: She is alert and oriented to person, place, and time.       5/5 strength in major muscle groups of  bilateral upper and lower extremities. Speech normal. No facial asymetry.     ED Course  Procedures (including critical care time)  Labs Reviewed - No data to display No results found.   1. Headache       MDM  Suspect migraine headache for the pt. Non focal neuro exam. No recent head trauma. No fever. Doubt meningitis. Doubt intracranial bleed. Doubt normal pressure hydrocephalus. No indication for imaging. Will treat with migraine cocktail and reevaluate  7:58 AM Resolution of her HA. Dc home        Lyanne Co, MD 02/05/11 [phone]

## 2011-03-27 ENCOUNTER — Encounter (HOSPITAL_COMMUNITY): Payer: Self-pay

## 2011-03-27 ENCOUNTER — Emergency Department (HOSPITAL_COMMUNITY)
Admission: EM | Admit: 2011-03-27 | Discharge: 2011-03-27 | Disposition: A | Discharge status: Home / Self Care | Payer: Self-pay | Attending: Emergency Medicine | Admitting: Emergency Medicine

## 2011-03-27 ENCOUNTER — Emergency Department (HOSPITAL_COMMUNITY): Payer: Self-pay

## 2011-03-27 DIAGNOSIS — M47812 Spondylosis without myelopathy or radiculopathy, cervical region: Secondary | ICD-10-CM | POA: Insufficient documentation

## 2011-03-27 DIAGNOSIS — R109 Unspecified abdominal pain: Secondary | ICD-10-CM | POA: Insufficient documentation

## 2011-03-27 DIAGNOSIS — J45909 Unspecified asthma, uncomplicated: Secondary | ICD-10-CM | POA: Insufficient documentation

## 2011-03-27 DIAGNOSIS — Z79899 Other long term (current) drug therapy: Secondary | ICD-10-CM | POA: Insufficient documentation

## 2011-03-27 DIAGNOSIS — E7800 Pure hypercholesterolemia, unspecified: Secondary | ICD-10-CM | POA: Insufficient documentation

## 2011-03-27 DIAGNOSIS — E860 Dehydration: Secondary | ICD-10-CM

## 2011-03-27 DIAGNOSIS — G43909 Migraine, unspecified, not intractable, without status migrainosus: Secondary | ICD-10-CM | POA: Insufficient documentation

## 2011-03-27 DIAGNOSIS — R1110 Vomiting, unspecified: Secondary | ICD-10-CM | POA: Insufficient documentation

## 2011-03-27 LAB — CBC
HCT: 41.8 % (ref 36.0–46.0)
MCHC: 32.8 g/dL (ref 30.0–36.0)
Platelets: 300 10*3/uL (ref 150–400)
RDW: 14.3 % (ref 11.5–15.5)

## 2011-03-27 LAB — URINALYSIS, ROUTINE W REFLEX MICROSCOPIC
Hgb urine dipstick: NEGATIVE
Protein, ur: NEGATIVE mg/dL
Urobilinogen, UA: 1 mg/dL (ref 0.0–1.0)

## 2011-03-27 LAB — COMPREHENSIVE METABOLIC PANEL
ALT: 21 U/L (ref 0–35)
BUN: 9 mg/dL (ref 6–23)
Calcium: 9.7 mg/dL (ref 8.4–10.5)
GFR calc Af Amer: >90 mL/min (ref >90)
Glucose, Bld: 110 mg/dL — ABNORMAL HIGH (ref 70–99)
Sodium: 136 mEq/L (ref 135–145)
Total Protein: 7.4 g/dL (ref 6.0–8.3)

## 2011-03-27 LAB — LIPASE, BLOOD: Lipase: 13 U/L (ref 11–59)

## 2011-03-27 MED ORDER — ONDANSETRON HCL 4 MG/2ML IJ SOLN
4.0000 mg | Freq: Once | INTRAMUSCULAR | Status: AC
  Administered 2011-03-27: 4 mg via INTRAVENOUS
  Filled 2011-03-27: qty 2

## 2011-03-27 MED ORDER — HYDROMORPHONE HCL PF 1 MG/ML IJ SOLN
1.0000 mg | Freq: Once | INTRAMUSCULAR | Status: AC
  Administered 2011-03-27: 1 mg via INTRAVENOUS
  Filled 2011-03-27: qty 1

## 2011-03-27 MED ORDER — ONDANSETRON 8 MG PO TBDP
8.0000 mg | ORAL_TABLET | Freq: Once | ORAL | Status: AC
  Administered 2011-03-27: 8 mg via ORAL
  Filled 2011-03-27: qty 1

## 2011-03-27 MED ORDER — SODIUM CHLORIDE 0.9 % IV BOLUS (SEPSIS)
1000.0000 mL | Freq: Once | INTRAVENOUS | Status: AC
  Administered 2011-03-27: 1000 mL via INTRAVENOUS

## 2011-03-27 MED ORDER — HYDROMORPHONE HCL PF 1 MG/ML IJ SOLN: 1.0000 mg | Freq: Once | INTRAMUSCULAR | Status: DC

## 2011-03-27 NOTE — ED Notes (Signed)
Pt complains of vomitng that started at 3am

## 2011-03-27 NOTE — ED Provider Notes (Signed)
History     CSN: [id number]  Arrival date & time 03/27/11  1478   First MD Initiated Contact with Patient 03/27/11 [phone]      Chief Complaint  Patient presents with  . Emesis    (Consider location/radiation/quality/duration/timing/severity/associated sxs/prior treatment) Patient is a 51 y.o. female presenting with vomiting. The history is provided by the patient.  Emesis  Pertinent negatives include no abdominal pain, no chills, no fever and no headaches.  pt w nv on set last pm. Emesis consists of recent ingested food initially, and now sl yellow, not bloody.  Left abd pain/cramping. Dull. States more or less constant. Non radiating. No exacerbating or alleviating factors. Feels in left abd/left flank. No hematuria or dysuria. occ non prod cough, denies other uri c/o. No fever or chills. No known bad food ingestion or ill contacts. No recent new med or abx use. Prior abd surgery includes cholecystectomy, hysterectomy, and hernia repair. Has been having normal bms. No constipation or diarrhea.   Past Medical History  Diagnosis Date  . Asthma   . Hypercholesteremia   . Migraine   . DJD (degenerative joint disease), cervical     Past Surgical History  Procedure Date  . Cholecystectomy   . Abdominal hysterectomy   . Hernia repair   . Carpal tunnel release     History reviewed. No pertinent family history.  History  Substance Use Topics  . Smoking status: Never Smoker   . Smokeless tobacco: Not on file  . Alcohol Use: No    OB History    Grav Para Term Preterm Abortions TAB SAB Ect Mult Living                  Review of Systems  Constitutional: Negative for fever and chills.  HENT: Negative for neck pain.   Eyes: Negative for redness.  Respiratory: Negative for shortness of breath.   Cardiovascular: Negative for chest pain and leg swelling.  Gastrointestinal: Positive for vomiting. Negative for abdominal pain.  Genitourinary: Negative for dysuria, hematuria,  vaginal bleeding and vaginal discharge.  Musculoskeletal: Negative for back pain.  Skin: Negative for rash.  Neurological: Negative for headaches.  Hematological: Does not bruise/bleed easily.  Psychiatric/Behavioral: Negative for confusion.    Allergies  Erythromycin and Phenytoin  Home Medications   Current Outpatient Rx  Name Route Sig Dispense Refill  . ALBUTEROL SULFATE HFA 108 (90 BASE) MCG/ACT IN AERS Inhalation Inhale 2 puffs into the lungs every 6 (six) hours as needed.    . ALBUTEROL SULFATE (5 MG/ML) 0.5% IN NEBU Nebulization Take 2.5 mg by nebulization every 6 (six) hours as needed.    Marland Kitchen ESOMEPRAZOLE MAGNESIUM 40 MG PO CPDR Oral Take 40 mg by mouth daily before breakfast.      BP 140/72  Pulse 96  Temp(Src) 97.9 F (36.6 C) (Oral)  Resp 20  SpO2 98%  Physical Exam  Nursing note and vitals reviewed. Constitutional: She is oriented to person, place, and time. She appears well-developed and well-nourished. No distress.  Eyes: Conjunctivae are normal. No scleral icterus.  Neck: Neck supple. No tracheal deviation present.  Cardiovascular: Normal rate, regular rhythm, normal heart sounds and intact distal pulses.  Exam reveals no gallop and no friction rub.   No murmur heard. Pulmonary/Chest: Effort normal and breath sounds normal. No respiratory distress.  Abdominal: Soft. Normal appearance and bowel sounds are normal. She exhibits no distension and no mass. There is tenderness. There is no rebound and no guarding.  Obese. Mild left abd tenderness, no rebound or guarding. No incarc hernia. bs present.   Genitourinary:       No cva tenderness  Musculoskeletal: She exhibits no edema and no tenderness.  Neurological: She is alert and oriented to person, place, and time.  Skin: Skin is warm and dry. No rash noted.  Psychiatric: She has a normal mood and affect.    ED Course  Procedures (including critical care time)  Results for orders placed during the  hospital encounter of 03/27/11  CBC      Component Value Range   WBC 7.5  4.0 - 10.5 (K/uL)   RBC 5.16 (*) 3.87 - 5.11 (MIL/uL)   Hemoglobin 13.7  12.0 - 15.0 (g/dL)   HCT 16.1  09.6 - 04.5 (%)   MCV 81.0  78.0 - 100.0 (fL)   MCH 26.6  26.0 - 34.0 (pg)   MCHC 32.8  30.0 - 36.0 (g/dL)   RDW 40.9  81.1 - 91.4 (%)   Platelets 300  150 - 400 (K/uL)  URINALYSIS, ROUTINE W REFLEX MICROSCOPIC      Component Value Range   Color, Urine YELLOW  YELLOW    APPearance CLOUDY (*) CLEAR    Specific Gravity, Urine 1.022  1.005 - 1.030    pH 7.5  5.0 - 8.0    Glucose, UA NEGATIVE  NEGATIVE (mg/dL)   Hgb urine dipstick NEGATIVE  NEGATIVE    Bilirubin Urine NEGATIVE  NEGATIVE    Ketones, ur NEGATIVE  NEGATIVE (mg/dL)   Protein, ur NEGATIVE  NEGATIVE (mg/dL)   Urobilinogen, UA 1.0  0.0 - 1.0 (mg/dL)   Nitrite NEGATIVE  NEGATIVE    Leukocytes, UA NEGATIVE  NEGATIVE   COMPREHENSIVE METABOLIC PANEL      Component Value Range   Sodium 136  135 - 145 (mEq/L)   Potassium 4.3  3.5 - 5.1 (mEq/L)   Chloride 103  96 - 112 (mEq/L)   CO2 16 (*) 19 - 32 (mEq/L)   Glucose, Bld 110 (*) 70 - 99 (mg/dL)   BUN 9  6 - 23 (mg/dL)   Creatinine, Ser 7.82  0.50 - 1.10 (mg/dL)   Calcium 9.7  8.4 - 95.6 (mg/dL)   Total Protein 7.4  6.0 - 8.3 (g/dL)   Albumin 3.7  3.5 - 5.2 (g/dL)   AST 27  0 - 37 (U/L)   ALT 21  0 - 35 (U/L)   Alkaline Phosphatase 74  39 - 117 (U/L)   Total Bilirubin 0.2 (*) 0.3 - 1.2 (mg/dL)   GFR calc non Af Amer >90  >90 (mL/min)   GFR calc Af Amer >90  >90 (mL/min)  LIPASE, BLOOD      Component Value Range   Lipase 13  11 - 59 (U/L)   Dg Abd Acute W/chest  03/27/2011  *RADIOLOGY REPORT*  Clinical Data: Left-sided abdominal pain with nausea and vomiting. Shortness of breath.  ACUTE ABDOMEN SERIES (ABDOMEN 2 VIEW & CHEST 1 VIEW)  Comparison: Chest x-ray dated 11/19/2010 and scout images for CT scan dated 05/08/2009  Findings: The heart and lungs are normal.  No free air in the abdomen.  There  is a paucity of air in the bowel.  There are several fluid- filled slightly prominent small bowel loops in the left mid abdomen.  Colon is not distended.  There is a previous cholecystectomy.  Abdominal wall mesh fasteners visible.  No osseous abnormality.  IMPRESSION: Slightly distended fluid-filled small bowel loops in  the left mid abdomen, nonspecific.  This pattern can be seen with gastroenteritis.  The pattern is not consistent with obstruction at this time.  Original Report Authenticated By: Gwynn Burly, M.D.       MDM  Iv ns bolus. zofran iv. Dilaudid 1 mg iv. Labs.   Reviewed nursing notes.  Additional 1 liter ns iv.   Recheck feels much improved. Tolerating po, no nv. abd soft nt.      Suzi Roots, MD 03/27/11 [phone]

## 2011-07-28 ENCOUNTER — Emergency Department (HOSPITAL_COMMUNITY)
Admission: EM | Admit: 2011-07-28 | Discharge: 2011-07-28 | Disposition: A | Discharge status: Home / Self Care | Payer: Self-pay | Attending: Emergency Medicine | Admitting: Emergency Medicine

## 2011-07-28 ENCOUNTER — Encounter (HOSPITAL_COMMUNITY): Payer: Self-pay | Admitting: Emergency Medicine

## 2011-07-28 ENCOUNTER — Emergency Department (HOSPITAL_COMMUNITY): Payer: Self-pay

## 2011-07-28 DIAGNOSIS — J45909 Unspecified asthma, uncomplicated: Secondary | ICD-10-CM | POA: Insufficient documentation

## 2011-07-28 DIAGNOSIS — R0602 Shortness of breath: Secondary | ICD-10-CM | POA: Insufficient documentation

## 2011-07-28 DIAGNOSIS — E7800 Pure hypercholesterolemia, unspecified: Secondary | ICD-10-CM | POA: Insufficient documentation

## 2011-07-28 MED ORDER — IPRATROPIUM BROMIDE 0.02 % IN SOLN
0.5000 mg | Freq: Once | RESPIRATORY_TRACT | Status: AC
  Administered 2011-07-28: 0.5 mg via RESPIRATORY_TRACT

## 2011-07-28 MED ORDER — IPRATROPIUM BROMIDE 0.02 % IN SOLN
0.5000 mg | Freq: Once | RESPIRATORY_TRACT | Status: AC
  Administered 2011-07-28: 0.5 mg via RESPIRATORY_TRACT
  Filled 2011-07-28: qty 2.5

## 2011-07-28 MED ORDER — PREDNISONE 20 MG PO TABS: 60.0000 mg | ORAL_TABLET | Freq: Every day | ORAL | Status: AC

## 2011-07-28 MED ORDER — ALBUTEROL SULFATE (5 MG/ML) 0.5% IN NEBU: 5.0000 mg | INHALATION_SOLUTION | Freq: Once | RESPIRATORY_TRACT | Status: DC

## 2011-07-28 MED ORDER — ALBUTEROL SULFATE (5 MG/ML) 0.5% IN NEBU
5.0000 mg | INHALATION_SOLUTION | Freq: Once | RESPIRATORY_TRACT | Status: AC
  Administered 2011-07-28: 5 mg via RESPIRATORY_TRACT
  Filled 2011-07-28: qty 1

## 2011-07-28 MED ORDER — PREDNISONE 20 MG PO TABS: 60.0000 mg | ORAL_TABLET | Freq: Once | ORAL | Status: DC

## 2011-07-28 MED ORDER — PREDNISONE 20 MG PO TABS
60.0000 mg | ORAL_TABLET | Freq: Once | ORAL | Status: AC
  Administered 2011-07-28: 60 mg via ORAL
  Filled 2011-07-28: qty 3

## 2011-07-28 MED ORDER — IPRATROPIUM BROMIDE 0.02 % IN SOLN
0.5000 mg | Freq: Once | RESPIRATORY_TRACT | Status: DC
  Filled 2011-07-28: qty 2.5

## 2011-07-28 NOTE — ED Notes (Signed)
Patient is alert and oriented x3.  She was given DC instructions and follow up visit instructions.  Patient gave verbal understanding. She was DC ambulatory under his own power to home.  V/S stable.  He was not showing any signs of distress on DC 

## 2011-07-28 NOTE — ED Provider Notes (Addendum)
History     CSN: [id number]  Arrival date & time 07/28/11  4098   First MD Initiated Contact with Patient 07/28/11 [phone]      Chief Complaint  Patient presents with  . Shortness of Breath    (Consider location/radiation/quality/duration/timing/severity/associated sxs/prior treatment) HPI... known asthmatic has had flareup of wheezing for the past 2-3 days.  She has tried her home nebulization machine was moderate results.  Shortness of breath with exertion. No fever, chills, rusty sputum.  Has been on prednisone in the past.  Severity is moderate.  Past Medical History  Diagnosis Date  . Asthma   . Hypercholesteremia   . Migraine   . DJD (degenerative joint disease), cervical     Past Surgical History  Procedure Date  . Cholecystectomy   . Abdominal hysterectomy   . Hernia repair   . Carpal tunnel release     No family history on file.  History  Substance Use Topics  . Smoking status: Never Smoker   . Smokeless tobacco: Not on file  . Alcohol Use: No    OB History    Grav Para Term Preterm Abortions TAB SAB Ect Mult Living                  Review of Systems  All other systems reviewed and are negative.    Allergies  Erythromycin; Other; and Phenytoin  Home Medications   Current Outpatient Rx  Name Route Sig Dispense Refill  . ALBUTEROL SULFATE HFA 108 (90 BASE) MCG/ACT IN AERS Inhalation Inhale 2 puffs into the lungs every 6 (six) hours as needed. For shortness of breath    . ALBUTEROL SULFATE (2.5 MG/3ML) 0.083% IN NEBU Nebulization Take 2.5 mg by nebulization every 6 (six) hours as needed.    . ATORVASTATIN CALCIUM 40 MG PO TABS Oral Take 40 mg by mouth at bedtime.    Marland Kitchen ESOMEPRAZOLE MAGNESIUM 40 MG PO CPDR Oral Take 40 mg by mouth daily before breakfast.    . IBUPROFEN 200 MG PO TABS Oral Take 800 mg by mouth every 6 (six) hours as needed. For pain    . METOPROLOL SUCCINATE ER 50 MG PO TB24 Oral Take 50 mg by mouth daily. Take with or immediately  following a meal.      BP 129/60  Pulse 92  Temp(Src) 98 F (36.7 C) (Oral)  Resp 18  SpO2 97%  Physical Exam  Nursing note and vitals reviewed. Constitutional: She is oriented to person, place, and time. She appears well-developed and well-nourished.  HENT:  Head: Normocephalic and atraumatic.  Eyes: Conjunctivae and EOM are normal. Pupils are equal, round, and reactive to light.  Neck: Normal range of motion. Neck supple.  Cardiovascular: Normal rate and regular rhythm.   Pulmonary/Chest: Effort normal.       Minimal expiratory wheezes bilaterally  Abdominal: Soft. Bowel sounds are normal.  Musculoskeletal: Normal range of motion.  Neurological: She is alert and oriented to person, place, and time.  Skin: Skin is warm and dry.  Psychiatric: She has a normal mood and affect.    ED Course  Procedures (including critical care time)  Labs Reviewed - No data to display No results found.   No diagnosis found.  Dg Chest 2 View  07/28/2011  *RADIOLOGY REPORT*  Clinical Data: Shortness of breath, cough, posterior chest pain, evaluate for pneumonia  CHEST - 2 VIEW  Comparison: 11/19/2010; 07/19/2007  Findings: Unchanged cardiac silhouette and mediastinal contours. No focal airspace opacities.  No pleural effusion or pneumothorax. Unchanged bones.  Post cholecystectomy.  IMPRESSION: No acute cardiopulmonary disease.  Specifically, no evidence of pneumonia.  Original Report Authenticated By: Waynard Reeds, M.D.    MDM  Patient's concern about pneumonia.  Will get chest x-ray, albuterol Atrovent nebulizer, start prednisone   Chest x-ray shows no pneumonia.  Feeling better after 2 breathing treatments. We'll start prednisone for one week     Donnetta Hutching, MD 07/28/11 1610  Donnetta Hutching, MD 07/28/11 0930

## 2011-07-28 NOTE — ED Notes (Signed)
Pt alert, nad, c/o cough and sob, onset several days ago, resp even unlabored, skin pwd, mild exp wheezes noted in triage

## 2011-07-28 NOTE — Discharge Instructions (Signed)
Chest x-ray was normal. Continue your breathing treatments.  Prednisone for one week. Followup your Dr.

## 2011-11-17 ENCOUNTER — Emergency Department (HOSPITAL_COMMUNITY)
Admission: EM | Admit: 2011-11-17 | Discharge: 2011-11-17 | Disposition: A | Discharge status: Home / Self Care | Payer: Self-pay | Attending: Emergency Medicine | Admitting: Emergency Medicine

## 2011-11-17 ENCOUNTER — Encounter (HOSPITAL_COMMUNITY): Payer: Self-pay | Admitting: Emergency Medicine

## 2011-11-17 DIAGNOSIS — M5030 Other cervical disc degeneration, unspecified cervical region: Secondary | ICD-10-CM | POA: Insufficient documentation

## 2011-11-17 DIAGNOSIS — X500XXA Overexertion from strenuous movement or load, initial encounter: Secondary | ICD-10-CM | POA: Insufficient documentation

## 2011-11-17 DIAGNOSIS — Y998 Other external cause status: Secondary | ICD-10-CM | POA: Insufficient documentation

## 2011-11-17 DIAGNOSIS — S39012A Strain of muscle, fascia and tendon of lower back, initial encounter: Secondary | ICD-10-CM

## 2011-11-17 DIAGNOSIS — IMO0002 Reserved for concepts with insufficient information to code with codable children: Secondary | ICD-10-CM | POA: Insufficient documentation

## 2011-11-17 DIAGNOSIS — Y9389 Activity, other specified: Secondary | ICD-10-CM | POA: Insufficient documentation

## 2011-11-17 DIAGNOSIS — E7800 Pure hypercholesterolemia, unspecified: Secondary | ICD-10-CM | POA: Insufficient documentation

## 2011-11-17 DIAGNOSIS — J45909 Unspecified asthma, uncomplicated: Secondary | ICD-10-CM | POA: Insufficient documentation

## 2011-11-17 LAB — URINALYSIS, ROUTINE W REFLEX MICROSCOPIC
Leukocytes, UA: NEGATIVE
Nitrite: NEGATIVE
Specific Gravity, Urine: 1.033 — ABNORMAL HIGH (ref 1.005–1.030)
Urobilinogen, UA: 1 mg/dL (ref 0.0–1.0)

## 2011-11-17 MED ORDER — MORPHINE SULFATE 4 MG/ML IJ SOLN
4.0000 mg | Freq: Once | INTRAMUSCULAR | Status: AC
  Administered 2011-11-17: 4 mg via INTRAMUSCULAR
  Filled 2011-11-17: qty 1

## 2011-11-17 MED ORDER — HYDROCODONE-ACETAMINOPHEN 5-500 MG PO TABS: 1.0000 | ORAL_TABLET | Freq: Four times a day (QID) | ORAL | Status: AC | PRN

## 2011-11-17 MED ORDER — CYCLOBENZAPRINE HCL 10 MG PO TABS: 10.0000 mg | ORAL_TABLET | Freq: Two times a day (BID) | ORAL | Status: AC | PRN

## 2011-11-17 MED ORDER — ONDANSETRON 4 MG PO TBDP
4.0000 mg | ORAL_TABLET | Freq: Once | ORAL | Status: AC
  Administered 2011-11-17: 4 mg via ORAL
  Filled 2011-11-17: qty 1

## 2011-11-17 NOTE — ED Provider Notes (Signed)
Medical screening examination/treatment/procedure(s) were performed by non-physician practitioner and as supervising physician I was immediately available for consultation/collaboration.   Gasper Hopes, MD 11/17/11 0749 

## 2011-11-17 NOTE — ED Notes (Signed)
Pt c/o low back pain onset yesterday, denies trauma. Pt c/o HA and generalized muscle pain

## 2011-11-17 NOTE — ED Provider Notes (Signed)
History     CSN: [id number]  Arrival date & time 11/17/11  0132   First MD Initiated Contact with Patient 11/17/11 0319      Chief Complaint  Patient presents with  . Back Pain    (Consider location/radiation/quality/duration/timing/severity/associated sxs/prior treatment) HPI Patient presents to the emergency department with complaints of left-sided low back pain. She states that she was moving boxes a couple of days ago and noticed the pain the next day. She states that her hurts depending on what position she is sitting in. She denies being unable to bend over and tie her shoes at this time. She denies any urine or bowel incontinence. She denies any weakness or IV drug use. Patient had a headache upon arrival but does have migraine disorder and admits that this is the same. She has had problems with back pain in the past and this is not different from any pain that she's had before. Her pain is currently a 6/10. Patient is in no acute distress vital signs are stable.   Past Medical History  Diagnosis Date  . Asthma   . Hypercholesteremia   . Migraine   . DJD (degenerative joint disease), cervical     Past Surgical History  Procedure Date  . Cholecystectomy   . Abdominal hysterectomy   . Hernia repair   . Carpal tunnel release     No family history on file.  History  Substance Use Topics  . Smoking status: Never Smoker   . Smokeless tobacco: Not on file  . Alcohol Use: No    OB History    Grav Para Term Preterm Abortions TAB SAB Ect Mult Living                  Review of Systems   Review of Systems  Gen: no weight loss, fevers, chills, night sweats  Eyes: no discharge or drainage, no occular pain or visual changes  Nose: no epistaxis or rhinorrhea  Mouth: no dental pain, no sore throat  Neck: no neck pain  Lungs:No wheezing, coughing or hemoptysis CV: no chest pain, palpitations, dependent edema or orthopnea  Abd: no abdominal pain, nausea, vomiting  GU:  no dysuria or gross hematuria  MSK:  + back pain  Neuro: no headache, no focal neurologic deficits  Skin: no abnormalities Psyche: negative.    Allergies  Erythromycin; Other; and Phenytoin  Home Medications   Current Outpatient Rx  Name Route Sig Dispense Refill  . ALBUTEROL SULFATE HFA 108 (90 BASE) MCG/ACT IN AERS Inhalation Inhale 2 puffs into the lungs every 6 (six) hours as needed. For shortness of breath    . ALBUTEROL SULFATE (2.5 MG/3ML) 0.083% IN NEBU Nebulization Take 2.5 mg by nebulization every 6 (six) hours as needed.    . ATORVASTATIN CALCIUM 40 MG PO TABS Oral Take 40 mg by mouth at bedtime.    . CYCLOBENZAPRINE HCL 10 MG PO TABS Oral Take 1 tablet (10 mg total) by mouth 2 (two) times daily as needed for muscle spasms. 20 tablet 0  . ESOMEPRAZOLE MAGNESIUM 40 MG PO CPDR Oral Take 40 mg by mouth daily before breakfast.    . HYDROCODONE-ACETAMINOPHEN 5-500 MG PO TABS Oral Take 1-2 tablets by mouth every 6 (six) hours as needed for pain. 15 tablet 0  . IBUPROFEN 200 MG PO TABS Oral Take 800 mg by mouth every 6 (six) hours as needed. For pain    . METOPROLOL SUCCINATE ER 50 MG PO TB24 Oral  Take 50 mg by mouth daily. Take with or immediately following a meal.      BP 125/62  Pulse 106  Temp 98.4 F (36.9 C) (Oral)  Resp 18  Ht 5\' 8"  (1.727 m)  Wt 260 lb (117.935 kg)  BMI 39.53 kg/m2  SpO2 95%  Physical Exam  Nursing note and vitals reviewed. Constitutional: She appears well-developed and well-nourished. No distress.  HENT:  Head: Normocephalic and atraumatic.  Eyes: Pupils are equal, round, and reactive to light.  Neck: Normal range of motion. Neck supple.  Cardiovascular: Normal rate and regular rhythm.   Pulmonary/Chest: Effort normal.  Abdominal: Soft.  Musculoskeletal:        Equal strength to bilateral lower extremities. Neurosensory  function adequate to both legs. Skin color is normal. Skin is warm and moist. I see no step off deformity, no bony  tenderness. Pt is able to ambulate without limp. Pain is relieved when sitting in certain positions. ROM is decreased due to pain. No crepitus, laceration, effusion, swelling.  Pulses are normal   Neurological: She is alert.  Skin: Skin is warm and dry.    ED Course  Procedures (including critical care time)  Labs Reviewed  URINALYSIS, ROUTINE W REFLEX MICROSCOPIC - Abnormal; Notable for the following:    Color, Urine AMBER (*)  BIOCHEMICALS MAY BE AFFECTED BY COLOR   APPearance CLOUDY (*)     Specific Gravity, Urine 1.033 (*)     Bilirubin Urine SMALL (*)     All other components within normal limits   No results found.   1. Back strain       MDM  Patient with back pain. No neurological deficits. Patient is ambulatory. No warning symptoms of back pain including: loss of bowel or bladder control, night sweats, waking from sleep with back pain, unexplained fevers or weight loss, h/o cancer, IVDU, recent trauma. No concern for cauda equina, epidural abscess, or other serious cause of back pain. Conservative measures such as rest, ice/heat and pain medicine indicated with PCP follow-up if no improvement with conservative management.           Dorthula Matas, PA 11/17/11 [phone]

## 2011-11-17 NOTE — ED Notes (Signed)
Pt c/o swelling in lower extreme ties, no swelling noted at present.  WNL pedal pulses. Pt has hx of UTI, pt reports"does not feel like a UTI.".

## 2012-01-08 ENCOUNTER — Encounter (HOSPITAL_COMMUNITY): Payer: Self-pay | Admitting: *Deleted

## 2012-01-08 ENCOUNTER — Emergency Department (HOSPITAL_COMMUNITY): Payer: Self-pay

## 2012-01-08 ENCOUNTER — Emergency Department (HOSPITAL_COMMUNITY)
Admission: EM | Admit: 2012-01-08 | Discharge: 2012-01-08 | Disposition: A | Discharge status: Home / Self Care | Payer: Self-pay | Attending: Emergency Medicine | Admitting: Emergency Medicine

## 2012-01-08 DIAGNOSIS — J45909 Unspecified asthma, uncomplicated: Secondary | ICD-10-CM | POA: Insufficient documentation

## 2012-01-08 DIAGNOSIS — G43909 Migraine, unspecified, not intractable, without status migrainosus: Secondary | ICD-10-CM | POA: Insufficient documentation

## 2012-01-08 DIAGNOSIS — S20229A Contusion of unspecified back wall of thorax, initial encounter: Secondary | ICD-10-CM | POA: Insufficient documentation

## 2012-01-08 DIAGNOSIS — E7800 Pure hypercholesterolemia, unspecified: Secondary | ICD-10-CM | POA: Insufficient documentation

## 2012-01-08 DIAGNOSIS — M1990 Unspecified osteoarthritis, unspecified site: Secondary | ICD-10-CM | POA: Insufficient documentation

## 2012-01-08 MED ORDER — OXYCODONE-ACETAMINOPHEN 5-325 MG PO TABS
1.0000 | ORAL_TABLET | Freq: Once | ORAL | Status: AC
  Administered 2012-01-08: 1 via ORAL
  Filled 2012-01-08: qty 1

## 2012-01-08 MED ORDER — BACITRACIN ZINC 500 UNIT/GM EX OINT
1.0000 "application " | TOPICAL_OINTMENT | Freq: Once | CUTANEOUS | Status: AC
  Administered 2012-01-08: 1 via TOPICAL
  Filled 2012-01-08: qty 0.9

## 2012-01-08 MED ORDER — NAPROXEN 500 MG PO TABS: 500.0000 mg | ORAL_TABLET | Freq: Two times a day (BID) | ORAL | Status: DC | PRN

## 2012-01-08 MED ORDER — HYDROCODONE-ACETAMINOPHEN 5-325 MG PO TABS: 1.0000 | ORAL_TABLET | Freq: Four times a day (QID) | ORAL | Status: DC | PRN

## 2012-01-08 NOTE — ED Notes (Signed)
Pt states she is in remission (8months), had brain tumor, has had headaches off and on x8 months
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
## 2012-03-15 ENCOUNTER — Encounter (HOSPITAL_COMMUNITY): Payer: Self-pay

## 2012-03-15 ENCOUNTER — Emergency Department (HOSPITAL_COMMUNITY)
Admission: EM | Admit: 2012-03-15 | Discharge: 2012-03-15 | Disposition: A | Discharge status: Home / Self Care | Payer: Self-pay | Attending: Emergency Medicine | Admitting: Emergency Medicine

## 2012-03-15 ENCOUNTER — Emergency Department (HOSPITAL_COMMUNITY): Payer: Self-pay

## 2012-03-15 DIAGNOSIS — J45909 Unspecified asthma, uncomplicated: Secondary | ICD-10-CM | POA: Insufficient documentation

## 2012-03-15 DIAGNOSIS — Z79899 Other long term (current) drug therapy: Secondary | ICD-10-CM | POA: Insufficient documentation

## 2012-03-15 DIAGNOSIS — G43909 Migraine, unspecified, not intractable, without status migrainosus: Secondary | ICD-10-CM | POA: Insufficient documentation

## 2012-03-15 DIAGNOSIS — R05 Cough: Secondary | ICD-10-CM

## 2012-03-15 DIAGNOSIS — J399 Disease of upper respiratory tract, unspecified: Secondary | ICD-10-CM

## 2012-03-15 DIAGNOSIS — Z8739 Personal history of other diseases of the musculoskeletal system and connective tissue: Secondary | ICD-10-CM | POA: Insufficient documentation

## 2012-03-15 DIAGNOSIS — J069 Acute upper respiratory infection, unspecified: Secondary | ICD-10-CM | POA: Insufficient documentation

## 2012-03-15 DIAGNOSIS — Z8669 Personal history of other diseases of the nervous system and sense organs: Secondary | ICD-10-CM | POA: Insufficient documentation

## 2012-03-15 DIAGNOSIS — E7800 Pure hypercholesterolemia, unspecified: Secondary | ICD-10-CM | POA: Insufficient documentation

## 2012-03-15 DIAGNOSIS — R059 Cough, unspecified: Secondary | ICD-10-CM | POA: Insufficient documentation

## 2012-03-15 HISTORY — DX: Unspecified convulsions: R56.9

## 2012-03-15 MED ORDER — HYDROCOD POLST-CHLORPHEN POLST 10-8 MG/5ML PO LQCR: 5.0000 mL | Freq: Two times a day (BID) | ORAL | Status: AC | PRN

## 2012-03-15 MED ORDER — BENZONATATE 200 MG PO CAPS: 200.0000 mg | ORAL_CAPSULE | Freq: Three times a day (TID) | ORAL | Status: AC | PRN

## 2012-03-15 MED ORDER — HYDROCOD POLST-CHLORPHEN POLST 10-8 MG/5ML PO LQCR
5.0000 mL | Freq: Once | ORAL | Status: AC
  Administered 2012-03-15: 5 mL via ORAL
  Filled 2012-03-15: qty 5

## 2012-03-15 MED ORDER — BENZONATATE 100 MG PO CAPS
200.0000 mg | ORAL_CAPSULE | Freq: Three times a day (TID) | ORAL | Status: DC | PRN
  Administered 2012-03-15: 200 mg via ORAL
  Filled 2012-03-15: qty 2

## 2012-03-15 NOTE — ED Notes (Signed)
Per pt, cough since yesterday with chills and generalized pain.  No fever noted at home.

## 2012-03-15 NOTE — ED Provider Notes (Signed)
History     CSN: [id number]  Arrival date & time 03/15/12  0032   First MD Initiated Contact with Patient 03/15/12 0117      Chief Complaint  Patient presents with  . Cough    (Consider location/radiation/quality/duration/timing/severity/associated sxs/prior treatment) HPI 52 year old female presents to emergency room complaining of persistent cough since last night. She reports nasal congestion and rhinorrhea as well. No known sick contacts, no fevers. She reports generalized body aches when moving. Patient with history of asthma, has been using her albuterol inhaler and nebulizer for cough. Last treatment at 10 PM. Patient did not receive a flu shot this year  Past Medical History  Diagnosis Date  . Asthma   . Hypercholesteremia   . Migraine   . DJD (degenerative joint disease), cervical   . Seizures     Past Surgical History  Procedure Date  . Cholecystectomy   . Abdominal hysterectomy   . Hernia repair   . Carpal tunnel release     History reviewed. No pertinent family history.  History  Substance Use Topics  . Smoking status: Never Smoker   . Smokeless tobacco: Not on file  . Alcohol Use: No    OB History    Grav Para Term Preterm Abortions TAB SAB Ect Mult Living                  Review of Systems  See History of Present Illness; otherwise all other systems are reviewed and negative Allergies  Erythromycin; Other; and Phenytoin  Home Medications   Current Outpatient Rx  Name  Route  Sig  Dispense  Refill  . ALBUTEROL SULFATE HFA 108 (90 BASE) MCG/ACT IN AERS   Inhalation   Inhale 2 puffs into the lungs every 6 (six) hours as needed. For shortness of breath         . ALBUTEROL SULFATE (2.5 MG/3ML) 0.083% IN NEBU   Nebulization   Take 2.5 mg by nebulization every 6 (six) hours as needed. Wheezing and shortness of breath         . ATORVASTATIN CALCIUM 40 MG PO TABS   Oral   Take 40 mg by mouth at bedtime.         Marland Kitchen ESOMEPRAZOLE MAGNESIUM  40 MG PO CPDR   Oral   Take 40 mg by mouth at bedtime.          . FLUOXETINE HCL 40 MG PO CAPS   Oral   Take 40 mg by mouth daily.         Marland Kitchen LORAZEPAM 0.5 MG PO TABS   Oral   Take 0.5 mg by mouth every 8 (eight) hours as needed. For anxiety         . METOPROLOL SUCCINATE ER 50 MG PO TB24   Oral   Take 50 mg by mouth daily. Take with or immediately following a meal.         . ZOLPIDEM TARTRATE 10 MG PO TABS   Oral   Take 10 mg by mouth at bedtime as needed. sleep         . SUMATRIPTAN SUCCINATE 6 MG/0.5ML [place] SOLN   Subcutaneous   Inject 6 mg into the skin every 2 (two) hours as needed. Migraine           BP 129/52  Pulse 91  Temp 98.8 F (37.1 C) (Oral)  Resp 18  SpO2 98%  Physical Exam  Nursing note and vitals reviewed. Constitutional: She is  oriented to person, place, and time. She appears well-developed and well-nourished. She appears distressed (uncomfortable appearing).  HENT:  Head: Normocephalic and atraumatic.  Nose: Nose normal.  Mouth/Throat: Oropharynx is clear and moist.  Eyes: Conjunctivae normal and EOM are normal. Pupils are equal, round, and reactive to light.  Neck: Normal range of motion. Neck supple. No JVD present. No tracheal deviation present. No thyromegaly present.  Cardiovascular: Normal rate, regular rhythm, normal heart sounds and intact distal pulses.  Exam reveals no gallop and no friction rub.   No murmur heard. Pulmonary/Chest: Effort normal and breath sounds normal. No stridor. No respiratory distress. She has no wheezes. She has no rales. She exhibits no tenderness.       Cough noted  Abdominal: Soft. Bowel sounds are normal. She exhibits no distension and no mass. There is no tenderness. There is no rebound and no guarding.  Musculoskeletal: Normal range of motion. She exhibits no edema and no tenderness.  Lymphadenopathy:    She has no cervical adenopathy.  Neurological: She is alert and oriented to person, place, and  time. She has normal reflexes. No cranial nerve deficit. She exhibits normal muscle tone. Coordination normal.  Skin: Skin is warm and dry. No rash noted. No erythema. No pallor.  Psychiatric: She has a normal mood and affect. Her behavior is normal. Judgment and thought content normal.    ED Course  Procedures (including critical care time)  Labs Reviewed - No data to display Dg Chest 2 View  03/15/2012  *RADIOLOGY REPORT*  Clinical Data: Persistent cough.  History of asthma.  CHEST - 2 VIEW  Comparison: 07/28/2011.  Findings: Normal sized heart.  Clear lungs.  Mild diffuse peribronchial thickening without significant change.  Mild thoracic spine degenerative changes.  Cholecystectomy clips.  IMPRESSION: Stable mild bronchitic changes.  No acute abnormality.   Original Report Authenticated By: Beckie Salts, M.D.      1. Cough   2. Upper respiratory disease       MDM  52 year old female with cough for about 24 hours, no fever. She is well-appearing not hypoxic. Suspect viral cause for her cough. Given history of asthma will check chest x-ray.  Chest x-ray unremarkable with stable bronchitic changes. Will have her continue albuterol  for any wheezing and will treat cough.        Olivia Mackie, MD 03/15/12 [phone]

## 2012-06-19 ENCOUNTER — Encounter: Payer: Self-pay | Admitting: Gastroenterology

## 2012-11-03 ENCOUNTER — Ambulatory Visit: Payer: Self-pay

## 2013-04-13 ENCOUNTER — Ambulatory Visit: Payer: PRIVATE HEALTH INSURANCE | Attending: Internal Medicine

## 2013-04-27 ENCOUNTER — Ambulatory Visit: Payer: Self-pay | Admitting: Internal Medicine
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
## 2013-06-25 ENCOUNTER — Encounter: Payer: Self-pay | Admitting: Internal Medicine

## 2013-06-25 ENCOUNTER — Ambulatory Visit: Payer: No Typology Code available for payment source | Attending: Internal Medicine | Admitting: Internal Medicine

## 2013-06-25 VITALS — BP 145/86 | HR 100 | Temp 98.5°F | Resp 17 | Wt 312.2 lb

## 2013-06-25 DIAGNOSIS — R569 Unspecified convulsions: Secondary | ICD-10-CM | POA: Insufficient documentation

## 2013-06-25 DIAGNOSIS — E785 Hyperlipidemia, unspecified: Secondary | ICD-10-CM | POA: Insufficient documentation

## 2013-06-25 DIAGNOSIS — F329 Major depressive disorder, single episode, unspecified: Secondary | ICD-10-CM

## 2013-06-25 DIAGNOSIS — Z008 Encounter for other general examination: Secondary | ICD-10-CM | POA: Insufficient documentation

## 2013-06-25 DIAGNOSIS — G935 Compression of brain: Secondary | ICD-10-CM

## 2013-06-25 DIAGNOSIS — F32A Depression, unspecified: Secondary | ICD-10-CM | POA: Insufficient documentation

## 2013-06-25 DIAGNOSIS — Z79899 Other long term (current) drug therapy: Secondary | ICD-10-CM | POA: Insufficient documentation

## 2013-06-25 DIAGNOSIS — I10 Essential (primary) hypertension: Secondary | ICD-10-CM | POA: Insufficient documentation

## 2013-06-25 DIAGNOSIS — F3289 Other specified depressive episodes: Secondary | ICD-10-CM | POA: Insufficient documentation

## 2013-06-25 DIAGNOSIS — J45909 Unspecified asthma, uncomplicated: Secondary | ICD-10-CM | POA: Insufficient documentation

## 2013-06-25 DIAGNOSIS — Z8669 Personal history of other diseases of the nervous system and sense organs: Secondary | ICD-10-CM | POA: Insufficient documentation

## 2013-06-25 DIAGNOSIS — Z1211 Encounter for screening for malignant neoplasm of colon: Secondary | ICD-10-CM

## 2013-06-25 DIAGNOSIS — K219 Gastro-esophageal reflux disease without esophagitis: Secondary | ICD-10-CM | POA: Insufficient documentation

## 2013-06-25 DIAGNOSIS — Z139 Encounter for screening, unspecified: Secondary | ICD-10-CM

## 2013-06-25 DIAGNOSIS — G43909 Migraine, unspecified, not intractable, without status migrainosus: Secondary | ICD-10-CM

## 2013-06-25 MED ORDER — FLUOXETINE HCL 40 MG PO CAPS: 40.0000 mg | ORAL_CAPSULE | Freq: Every day | ORAL | Status: DC

## 2013-06-25 MED ORDER — ALBUTEROL SULFATE HFA 108 (90 BASE) MCG/ACT IN AERS: 2.0000 | INHALATION_SPRAY | Freq: Four times a day (QID) | RESPIRATORY_TRACT | Status: DC | PRN

## 2013-06-25 MED ORDER — ATORVASTATIN CALCIUM 40 MG PO TABS: 40.0000 mg | ORAL_TABLET | Freq: Every day | ORAL | Status: DC

## 2013-06-25 MED ORDER — METOPROLOL SUCCINATE ER 50 MG PO TB24: 50.0000 mg | ORAL_TABLET | Freq: Every day | ORAL | Status: DC

## 2013-06-25 NOTE — Progress Notes (Signed)
Patient Demographics  Julie Fuller, is a 53 y.o. female  ZOX:[id number]:[id number]  [id number]  DOB - 1960-07-26  CC:  Chief Complaint  Patient presents with  . Establish Care       HPI: Julie Fuller is a 53 y.o. female here today to establish medical care. Has history of hypertension has not GERD her depression hyperlipidemia, migraine headaches Bell's palsy Arnold-Chiari malformation type I as per patient she was seen a neurologist in the past and was on Imitrex injections, she lost her insurance and currently not taking any medications. Patient denies any acute symptoms. Has history of arthritis in knees and was seen I and orthopedics in the past.  Patient has No headache, No chest pain, No abdominal pain - No Nausea, No new weakness tingling or numbness, No Cough - SOB.  Allergies  Allergen Reactions  . Erythromycin     REACTION: hives  . Other Nausea Only    Goat cheese: red bumps & made her really hyper  . Phenytoin     REACTION: nausea, hives   Past Medical History  Diagnosis Date  . Asthma   . Hypercholesteremia   . Migraine   . DJD (degenerative joint disease), cervical   . Seizures    Current Outpatient Prescriptions on File Prior to Visit  Medication Sig Dispense Refill  . albuterol (PROVENTIL) (2.5 MG/3ML) 0.083% nebulizer solution Take 2.5 mg by nebulization every 6 (six) hours as needed. Wheezing and shortness of breath      . benzonatate (TESSALON) 200 MG capsule Take 1 capsule (200 mg total) by mouth 3 (three) times daily as needed for cough.  20 capsule  0  . chlorpheniramine-HYDROcodone (TUSSIONEX) 10-8 MG/5ML LQCR Take 5 mLs by mouth every 12 (twelve) hours as needed.  140 mL  0  . esomeprazole (NEXIUM) 40 MG capsule Take 40 mg by mouth at bedtime.       Marland Kitchen. LORazepam (ATIVAN) 0.5 MG tablet Take 0.5 mg by mouth every 8 (eight) hours as needed. For anxiety      . SUMAtriptan (IMITREX) 6 MG/0.5ML SOLN injection Inject 6 mg into the skin every 2 (two) hours  as needed. Migraine      . zolpidem (AMBIEN) 10 MG tablet Take 10 mg by mouth at bedtime as needed. sleep       No current facility-administered medications on file prior to visit.   Family History  Problem Relation Age of Onset  . Hypertension Mother   . Diabetes Mother   . Hypertension Sister   . Cancer Son     melanoma    History   Social History  . Marital Status: Legally Separated    Spouse Name: N/A    Number of Children: N/A  . Years of Education: N/A   Occupational History  . Not on file.   Social History Main Topics  . Smoking status: Never Smoker   . Smokeless tobacco: Not on file  . Alcohol Use: No  . Drug Use: No  . Sexual Activity: Not on file   Other Topics Concern  . Not on file   Social History Narrative  . No narrative on file    Review of Systems: Constitutional: Negative for fever, chills, diaphoresis, activity change, appetite change and fatigue. HENT: Negative for ear pain, nosebleeds, congestion, facial swelling, rhinorrhea, neck pain, neck stiffness and ear discharge.  Eyes: Negative for pain, discharge, redness, itching and visual disturbance. Respiratory: Negative for cough, choking, chest tightness, shortness of  breath, wheezing and stridor.  Cardiovascular: Negative for chest pain, palpitations and leg swelling. Gastrointestinal: Negative for abdominal distention. Genitourinary: Negative for dysuria, urgency, frequency, hematuria, flank pain, decreased urine volume, difficulty urinating and dyspareunia.  Musculoskeletal: Negative for back pain, joint swelling, +arthralgia and gait problem. Neurological: Negative for dizziness, tremors, seizures, syncope, facial asymmetry, speech difficulty, weakness, light-headedness, numbness and headaches.  Hematological: Negative for adenopathy. Does not bruise/bleed easily. Psychiatric/Behavioral: Negative for hallucinations, behavioral problems, confusion, dysphoric mood, decreased concentration and  agitation.    Objective:   Filed Vitals:   06/25/13 1212  BP: 145/86  Pulse: 100  Temp: 98.5 F (36.9 C)  Resp: 17    Physical Exam: Constitutional: Obese female sitting comfortably not in acute distress HENT: Normocephalic, atraumatic, External right and left ear normal. Oropharynx is clear and moist.  Eyes: Conjunctivae and EOM are normal. PERRLA, no scleral icterus. Neck: Normal ROM. Neck supple. No JVD. No tracheal deviation. No thyromegaly. CVS: RRR, S1/S2 +, no murmurs, no gallops, no carotid bruit.  Pulmonary: Effort and breath sounds normal, no stridor, rhonchi, wheezes, rales.  Abdominal: Soft. BS +, no distension, tenderness, rebound or guarding.  Musculoskeletal: Normal range of motion. No edema and no tenderness. Right knee crepitation positive.  Neuro: Alert. Normal reflexes, muscle tone coordination. No cranial nerve deficit. Skin: Skin is warm and dry. No rash noted. Not diaphoretic. No erythema. No pallor. Psychiatric: Normal mood and affect. Behavior, judgment, thought content normal.  Lab Results  Component Value Date   WBC 7.5 03/27/2011   HGB 13.7 03/27/2011   HCT 41.8 03/27/2011   MCV 81.0 03/27/2011   PLT 300 03/27/2011   Lab Results  Component Value Date   CREATININE 0.50 03/27/2011   BUN 9 03/27/2011   NA 136 03/27/2011   K 4.3 03/27/2011   CL 103 03/27/2011   CO2 16* 03/27/2011    No results found for this basename: HGBA1C   Lipid Panel  No results found for this basename: chol, trig, hdl, cholhdl, vldl, ldlcalc       Assessment and plan:   1. Essential hypertension, benign Advised for DASH diet resume back on metoprolol. - metoprolol succinate (TOPROL-XL) 50 MG 24 hr tablet; Take 1 tablet (50 mg total) by mouth daily. Take with or immediately following a meal.  Dispense: 30 tablet; Refill: 3  2. Depression Patient denies any SI or HI, patient wants to resume back on Prozac.  - FLUoxetine (PROZAC) 40 MG capsule; Take 1 capsule (40 mg total)  by mouth daily.  Dispense: 30 capsule; Refill: 3  3. Other and unspecified hyperlipidemia Resume back on Lipitor. Her will check lipid panel. - atorvastatin (LIPITOR) 40 MG tablet; Take 1 tablet (40 mg total) by mouth at bedtime.  Dispense: 30 tablet; Refill: 3  4. GERD (gastroesophageal reflux disease) OTC reflux medication.  5. Extrinsic asthma, unspecified  - albuterol (PROVENTIL HFA;VENTOLIN HFA) 108 (90 BASE) MCG/ACT inhaler; Inhale 2 puffs into the lungs every 6 (six) hours as needed. For shortness of breath  Dispense: 18 g; Refill: 3  6. Migraine/7. Chiari malformation type I/8. History of Bell's palsy  - Ambulatory referral to Neurology  10. Screening Baseline testing blood work  - MM DIGITAL SCREENING BILATERAL; Future - CBC with Differential; Future - COMPLETE METABOLIC PANEL WITH GFR; Future - Lipid panel; Future - TSH; Future - Vit D  25 hydroxy (rtn osteoporosis monitoring); Future    Return in about 3 months (around 09/24/2013) for hypertension, hyperipidemia.  Lorayne Marek, MD

## 2013-06-25 NOTE — Progress Notes (Signed)
Patient here to establish care Has not been on her medications in a while Uses an inhaler and nebulizer

## 2013-06-29 ENCOUNTER — Other Ambulatory Visit: Payer: Self-pay

## 2013-06-29 ENCOUNTER — Ambulatory Visit: Payer: No Typology Code available for payment source | Attending: Internal Medicine

## 2013-06-29 DIAGNOSIS — J45909 Unspecified asthma, uncomplicated: Secondary | ICD-10-CM

## 2013-06-29 MED ORDER — ESOMEPRAZOLE MAGNESIUM 40 MG PO CPDR: 40.0000 mg | DELAYED_RELEASE_CAPSULE | Freq: Every day | ORAL | Status: DC

## 2013-06-29 MED ORDER — ALBUTEROL SULFATE HFA 108 (90 BASE) MCG/ACT IN AERS: 2.0000 | INHALATION_SPRAY | Freq: Four times a day (QID) | RESPIRATORY_TRACT | Status: DC | PRN

## 2013-07-26 ENCOUNTER — Telehealth: Payer: Self-pay | Admitting: Neurology

## 2013-07-26 ENCOUNTER — Ambulatory Visit: Payer: No Typology Code available for payment source | Admitting: Neurology

## 2013-07-26 NOTE — Telephone Encounter (Signed)
Pt no showed today's NP appt w/ Dr. Everlena CooperJaffe. A no show letter has been mailed to the pt. / Sherri S.

## 2013-08-16 ENCOUNTER — Ambulatory Visit: Payer: No Typology Code available for payment source

## 2013-08-17 ENCOUNTER — Ambulatory Visit: Payer: No Typology Code available for payment source | Attending: Internal Medicine

## 2013-08-17 DIAGNOSIS — Z139 Encounter for screening, unspecified: Secondary | ICD-10-CM

## 2013-08-17 LAB — COMPLETE METABOLIC PANEL WITH GFR
ALBUMIN: 4.2 g/dL (ref 3.5–5.2)
ALT: 126 U/L — AB (ref 0–35)
AST: 62 U/L — ABNORMAL HIGH (ref 0–37)
Alkaline Phosphatase: 137 U/L — ABNORMAL HIGH (ref 39–117)
BILIRUBIN TOTAL: 0.3 mg/dL (ref 0.2–1.2)
BUN: 13 mg/dL (ref 6–23)
CALCIUM: 9.9 mg/dL (ref 8.4–10.5)
CHLORIDE: 103 meq/L (ref 96–112)
CO2: 27 meq/L (ref 19–32)
Creat: 0.69 mg/dL (ref 0.50–1.10)
GLUCOSE: 135 mg/dL — AB (ref 70–99)
POTASSIUM: 4.9 meq/L (ref 3.5–5.3)
SODIUM: 139 meq/L (ref 135–145)
TOTAL PROTEIN: 6.7 g/dL (ref 6.0–8.3)

## 2013-08-17 LAB — CBC WITH DIFFERENTIAL/PLATELET
Basophils Absolute: 0 10*3/uL (ref 0.0–0.1)
Basophils Relative: 0 % (ref 0–1)
EOS ABS: 0 10*3/uL (ref 0.0–0.7)
Eosinophils Relative: 0 % (ref 0–5)
HCT: 39 % (ref 36.0–46.0)
HEMOGLOBIN: 12.8 g/dL (ref 12.0–15.0)
LYMPHS ABS: 2.7 10*3/uL (ref 0.7–4.0)
LYMPHS PCT: 39 % (ref 12–46)
MCH: 25.7 pg — AB (ref 26.0–34.0)
MCHC: 32.8 g/dL (ref 30.0–36.0)
MCV: 78.3 fL (ref 78.0–100.0)
MONOS PCT: 5 % (ref 3–12)
Monocytes Absolute: 0.3 10*3/uL (ref 0.1–1.0)
NEUTROS PCT: 56 % (ref 43–77)
Neutro Abs: 3.8 10*3/uL (ref 1.7–7.7)
PLATELETS: 317 10*3/uL (ref 150–400)
RBC: 4.98 MIL/uL (ref 3.87–5.11)
RDW: 16.7 % — ABNORMAL HIGH (ref 11.5–15.5)
WBC: 6.8 10*3/uL (ref 4.0–10.5)

## 2013-08-17 LAB — LIPID PANEL
Cholesterol: 263 mg/dL — ABNORMAL HIGH (ref 0–200)
HDL: 59 mg/dL (ref >39)
LDL CALC: 160 mg/dL — AB (ref 0–99)
Total CHOL/HDL Ratio: 4.5 Ratio
Triglycerides: 218 mg/dL — ABNORMAL HIGH (ref <150)
VLDL: 44 mg/dL — ABNORMAL HIGH (ref 0–40)

## 2013-08-17 LAB — TSH: TSH: 2.47 u[IU]/mL (ref 0.350–4.500)

## 2013-08-18 ENCOUNTER — Other Ambulatory Visit: Payer: Self-pay | Admitting: Internal Medicine

## 2013-08-18 ENCOUNTER — Telehealth: Payer: Self-pay

## 2013-08-18 DIAGNOSIS — R7989 Other specified abnormal findings of blood chemistry: Secondary | ICD-10-CM

## 2013-08-18 DIAGNOSIS — R945 Abnormal results of liver function studies: Secondary | ICD-10-CM

## 2013-08-18 LAB — VITAMIN D 25 HYDROXY (VIT D DEFICIENCY, FRACTURES): VIT D 25 HYDROXY: 32 ng/mL (ref 30–89)

## 2013-08-18 NOTE — Telephone Encounter (Signed)
Called patient for lab results Number has been temporarily disconnected

## 2013-08-18 NOTE — Telephone Encounter (Signed)
Message copied by Lestine Mount on Wed Aug 18, 2013 10:38 AM ------      Message from: Doris Cheadle      Created: Wed Aug 18, 2013 10:28 AM       Blood work reviewed, noticed  abnormal liver function test, I have ordered  hepatitis panel, call and advise patient to do the blood work prior to the next visit.      Also  noticed impaired fasting glucose and elevated cholesterol , call and advise patient for low carbohydrate and low fat  diet.       ------

## 2013-09-24 ENCOUNTER — Encounter: Payer: Self-pay | Admitting: Internal Medicine

## 2013-09-24 ENCOUNTER — Ambulatory Visit: Payer: No Typology Code available for payment source | Attending: Internal Medicine | Admitting: Internal Medicine

## 2013-09-24 VITALS — BP 152/90 | HR 76 | Temp 98.1°F | Resp 16 | Wt 308.8 lb

## 2013-09-24 DIAGNOSIS — R7989 Other specified abnormal findings of blood chemistry: Secondary | ICD-10-CM

## 2013-09-24 DIAGNOSIS — M1710 Unilateral primary osteoarthritis, unspecified knee: Secondary | ICD-10-CM

## 2013-09-24 DIAGNOSIS — F329 Major depressive disorder, single episode, unspecified: Secondary | ICD-10-CM | POA: Insufficient documentation

## 2013-09-24 DIAGNOSIS — F32A Depression, unspecified: Secondary | ICD-10-CM

## 2013-09-24 DIAGNOSIS — F3289 Other specified depressive episodes: Secondary | ICD-10-CM

## 2013-09-24 DIAGNOSIS — R7301 Impaired fasting glucose: Secondary | ICD-10-CM

## 2013-09-24 DIAGNOSIS — R945 Abnormal results of liver function studies: Secondary | ICD-10-CM | POA: Insufficient documentation

## 2013-09-24 DIAGNOSIS — J45909 Unspecified asthma, uncomplicated: Secondary | ICD-10-CM

## 2013-09-24 DIAGNOSIS — M1711 Unilateral primary osteoarthritis, right knee: Secondary | ICD-10-CM

## 2013-09-24 DIAGNOSIS — K219 Gastro-esophageal reflux disease without esophagitis: Secondary | ICD-10-CM

## 2013-09-24 DIAGNOSIS — E785 Hyperlipidemia, unspecified: Secondary | ICD-10-CM

## 2013-09-24 DIAGNOSIS — I10 Essential (primary) hypertension: Secondary | ICD-10-CM

## 2013-09-24 NOTE — Progress Notes (Signed)
MRN: [id number] Name: Julie Fuller  Sex: female Age: 53 y.o. DOB: 1960-11-24  Allergies: Erythromycin; Other; and Phenytoin  Chief Complaint  Patient presents with  . Follow-up    HPI: Patient is 53 y.o. female who  has to of hypertension hyperlipidemia, depression, GERD comes today for followup, recently had a blood work done which was reviewed with the patient noticed elevated cholesterol impaired fasting glucose and abnormal LFTs, she denies any acute symptoms denies any nausea vomiting change in bowel habits. Patient has history of chronic right knee pain as per patient she used to see an orthopedics and was told of severe osteoarthritis and she needs a knee replacement, due to his insurance issues she could not follow.  Past Medical History  Diagnosis Date  . Asthma   . Hypercholesteremia   . Migraine   . DJD (degenerative joint disease), cervical   . Seizures     Past Surgical History  Procedure Laterality Date  . Cholecystectomy    . Abdominal hysterectomy    . Hernia repair    . Carpal tunnel release        Medication List       This list is accurate as of: 09/24/13  9:48 AM.  Always use your most recent med list.               albuterol (2.5 MG/3ML) 0.083% nebulizer solution  Commonly known as:  PROVENTIL  Take 2.5 mg by nebulization every 6 (six) hours as needed. Wheezing and shortness of breath     albuterol 108 (90 BASE) MCG/ACT inhaler  Commonly known as:  PROVENTIL HFA;VENTOLIN HFA  Inhale 2 puffs into the lungs every 6 (six) hours as needed. For shortness of breath     atorvastatin 40 MG tablet  Commonly known as:  LIPITOR  Take 1 tablet (40 mg total) by mouth at bedtime.     benzonatate 200 MG capsule  Commonly known as:  TESSALON  Take 1 capsule (200 mg total) by mouth 3 (three) times daily as needed for cough.     chlorpheniramine-HYDROcodone 10-8 MG/5ML Lqcr  Commonly known as:  TUSSIONEX  Take 5 mLs by mouth every 12 (twelve) hours  as needed.     esomeprazole 40 MG capsule  Commonly known as:  NEXIUM  Take 1 capsule (40 mg total) by mouth at bedtime.     FLUoxetine 40 MG capsule  Commonly known as:  PROZAC  Take 1 capsule (40 mg total) by mouth daily.     LORazepam 0.5 MG tablet  Commonly known as:  ATIVAN  Take 0.5 mg by mouth every 8 (eight) hours as needed. For anxiety     metoprolol succinate 50 MG 24 hr tablet  Commonly known as:  TOPROL-XL  Take 1 tablet (50 mg total) by mouth daily. Take with or immediately following a meal.     SUMAtriptan 6 MG/0.5ML Soln injection  Commonly known as:  IMITREX  Inject 6 mg into the skin every 2 (two) hours as needed. Migraine     zolpidem 10 MG tablet  Commonly known as:  AMBIEN  Take 10 mg by mouth at bedtime as needed. sleep        No orders of the defined types were placed in this encounter.     There is no immunization history on file for this patient.  Family History  Problem Relation Age of Onset  . Hypertension Mother   . Diabetes Mother   .  Hypertension Sister   . Cancer Son     melanoma     History  Substance Use Topics  . Smoking status: Never Smoker   . Smokeless tobacco: Not on file  . Alcohol Use: No    Review of Systems   As noted in HPI  Filed Vitals:   09/24/13 0933  BP: 152/90  Pulse: 76  Temp: 98.1 F (36.7 C)  Resp: 16    Physical Exam  Physical Exam  Constitutional: She is oriented to person, place, and time. No distress.  Eyes: EOM are normal. Pupils are equal, round, and reactive to light.  Cardiovascular: Normal rate and regular rhythm.   Pulmonary/Chest: Breath sounds normal. No respiratory distress. She has no wheezes. She has no rales.  Musculoskeletal:  Right knee crepitation+  Neurological: She is alert and oriented to person, place, and time.    CBC    Component Value Date/Time   WBC 6.8 08/17/2013 0959   RBC 4.98 08/17/2013 0959   HGB 12.8 08/17/2013 0959   HCT 39.0 08/17/2013 0959   PLT 317  08/17/2013 0959   MCV 78.3 08/17/2013 0959   LYMPHSABS 2.7 08/17/2013 0959   MONOABS 0.3 08/17/2013 0959   EOSABS 0.0 08/17/2013 0959   BASOSABS 0.0 08/17/2013 0959    CMP     Component Value Date/Time   NA 139 08/17/2013 0959   K 4.9 08/17/2013 0959   CL 103 08/17/2013 0959   CO2 27 08/17/2013 0959   GLUCOSE 135* 08/17/2013 0959   BUN 13 08/17/2013 0959   CREATININE 0.69 08/17/2013 0959   CREATININE 0.50 03/27/2011 0925   CALCIUM 9.9 08/17/2013 0959   PROT 6.7 08/17/2013 0959   ALBUMIN 4.2 08/17/2013 0959   AST 62* 08/17/2013 0959   ALT 126* 08/17/2013 0959   ALKPHOS 137* 08/17/2013 0959   BILITOT 0.3 08/17/2013 0959   GFRNONAA >89 08/17/2013 0959   GFRNONAA >90 03/27/2011 0925   GFRAA >89 08/17/2013 0959   GFRAA >90 03/27/2011 0925    Lab Results  Component Value Date/Time   CHOL 263* 08/17/2013  9:59 AM    No components found with this basename: hga1c    Lab Results  Component Value Date/Time   AST 62* 08/17/2013  9:59 AM    Assessment and Plan  Essential hypertension, benign Blood pressure is borderline elevated, I have advised patient for DASH diet continue with metoprolol 50 mg daily.   reassess on the next visit if blood pressure is persistently high consider adding another blood pressure medication.  Extrinsic asthma, unspecified  well controlled continue with Lipitor when necessary.   Gastroesophageal reflux disease without esophagitis Lifestyle modification and patient is on Nexium.  Other and unspecified hyperlipidemia  I reinforced low-fat diet, continue with Lipitor 40 mg daily, will recheck fasting lipid panel on the next visit.  Abnormal LFTs - Plan: I have ordered hepatitis panel, advise patient to avoid alcohol and Tylenol. Hepatitis B surface antibody, Hepatitis B surface antigen, Hepatitis C antibody  Depression  symptoms well controlled continue with Prozac.  IFG (impaired fasting glucose)  advise for low carbohydrate diet  Primary osteoarthritis of right knee - Plan: Ambulatory  referral to Orthopedic Surgery   Health Maintenance -Colonoscopy: uptodate   -Mammogram: patient is going to schedule apt  -  Return in about 3 months (around 12/25/2013) for hypertension.  Doris CheadleADVANI, Shateria Paternostro, MD

## 2013-09-24 NOTE — Patient Instructions (Signed)
Diabetes Mellitus and Food It is important for you to manage your blood sugar (glucose) level. Your blood glucose level can be greatly affected by what you eat. Eating healthier foods in the appropriate amounts throughout the day at about the same time each day will help you control your blood glucose level. It can also help slow or prevent worsening of your diabetes mellitus. Healthy eating may even help you improve the level of your blood pressure and reach or maintain a healthy weight.  HOW CAN FOOD AFFECT ME? Carbohydrates Carbohydrates affect your blood glucose level more than any other type of food. Your dietitian will help you determine how many carbohydrates to eat at each meal and teach you how to count carbohydrates. Counting carbohydrates is important to keep your blood glucose at a healthy level, especially if you are using insulin or taking certain medicines for diabetes mellitus. Alcohol Alcohol can cause sudden decreases in blood glucose (hypoglycemia), especially if you use insulin or take certain medicines for diabetes mellitus. Hypoglycemia can be a life-threatening condition. Symptoms of hypoglycemia (sleepiness, dizziness, and disorientation) are similar to symptoms of having too much alcohol.  If your health care provider has given you approval to drink alcohol, do so in moderation and use the following guidelines:  Women should not have more than one drink per day, and men should not have more than two drinks per day. One drink is equal to:  12 oz of beer.  5 oz of wine.  1 oz of hard liquor.  Do not drink on an empty stomach.  Keep yourself hydrated. Have water, diet soda, or unsweetened iced tea.  Regular soda, juice, and other mixers might contain a lot of carbohydrates and should be counted. WHAT FOODS ARE NOT RECOMMENDED? As you make food choices, it is important to remember that all foods are not the same. Some foods have fewer nutrients per serving than other  foods, even though they might have the same number of calories or carbohydrates. It is difficult to get your body what it needs when you eat foods with fewer nutrients. Examples of foods that you should avoid that are high in calories and carbohydrates but low in nutrients include:  Trans fats (most processed foods list trans fats on the Nutrition Facts label).  Regular soda.  Juice.  Candy.  Sweets, such as cake, pie, doughnuts, and cookies.  Fried foods. WHAT FOODS CAN I EAT? Have nutrient-rich foods, which will nourish your body and keep you healthy. The food you should eat also will depend on several factors, including:  The calories you need.  The medicines you take.  Your weight.  Your blood glucose level.  Your blood pressure level.  Your cholesterol level. You also should eat a variety of foods, including:  Protein, such as meat, poultry, fish, tofu, nuts, and seeds (lean animal proteins are best).  Fruits.  Vegetables.  Dairy products, such as milk, cheese, and yogurt (low fat is best).  Breads, grains, pasta, cereal, rice, and beans.  Fats such as olive oil, trans fat-free margarine, canola oil, avocado, and olives. DOES EVERYONE WITH DIABETES MELLITUS HAVE THE SAME MEAL PLAN? Because every person with diabetes mellitus is different, there is not one meal plan that works for everyone. It is very important that you meet with a dietitian who will help you create a meal plan that is just right for you. Document Released: 11/22/2004 Document Revised: 03/02/2013 Document Reviewed: 01/22/2013 ExitCare Patient Information 2015 [employer]. This   information is not intended to replace advice given to you by your health care provider. Make sure you discuss any questions you have with your health care provider. DASH Eating Plan DASH stands for "Dietary Approaches to Stop Hypertension." The DASH eating plan is a healthy eating plan that has been shown to reduce high  blood pressure (hypertension). Additional health benefits may include reducing the risk of type 2 diabetes mellitus, heart disease, and stroke. The DASH eating plan may also help with weight loss. WHAT DO I NEED TO KNOW ABOUT THE DASH EATING PLAN? For the DASH eating plan, you will follow these general guidelines:  Choose foods with a percent daily value for sodium of less than 5% (as listed on the food label).  Use salt-free seasonings or herbs instead of table salt or sea salt.  Check with your health care provider or pharmacist before using salt substitutes.  Eat lower-sodium products, often labeled as "lower sodium" or "no salt added."  Eat fresh foods.  Eat more vegetables, fruits, and low-fat dairy products.  Choose whole grains. Look for the word "whole" as the first word in the ingredient list.  Choose fish and skinless chicken or turkey more often than red meat. Limit fish, poultry, and meat to 6 oz (170 g) each day.  Limit sweets, desserts, sugars, and sugary drinks.  Choose heart-healthy fats.  Limit cheese to 1 oz (28 g) per day.  Eat more home-cooked food and less restaurant, buffet, and fast food.  Limit fried foods.  Cook foods using methods other than frying.  Limit canned vegetables. If you do use them, rinse them well to decrease the sodium.  When eating at a restaurant, ask that your food be prepared with less salt, or no salt if possible. WHAT FOODS CAN I EAT? Seek help from a dietitian for individual calorie needs. Grains Whole grain or whole wheat bread. Brown rice. Whole grain or whole wheat pasta. Quinoa, bulgur, and whole grain cereals. Low-sodium cereals. Corn or whole wheat flour tortillas. Whole grain cornbread. Whole grain crackers. Low-sodium crackers. Vegetables Fresh or frozen vegetables (raw, steamed, roasted, or grilled). Low-sodium or reduced-sodium tomato and vegetable juices. Low-sodium or reduced-sodium tomato sauce and paste. Low-sodium  or reduced-sodium canned vegetables.  Fruits All fresh, canned (in natural juice), or frozen fruits. Meat and Other Protein Products Ground beef (85% or leaner), grass-fed beef, or beef trimmed of fat. Skinless chicken or turkey. Ground chicken or turkey. Pork trimmed of fat. All fish and seafood. Eggs. Dried beans, peas, or lentils. Unsalted nuts and seeds. Unsalted canned beans. Dairy Low-fat dairy products, such as skim or 1% milk, 2% or reduced-fat cheeses, low-fat ricotta or cottage cheese, or plain low-fat yogurt. Low-sodium or reduced-sodium cheeses. Fats and Oils Tub margarines without trans fats. Light or reduced-fat mayonnaise and salad dressings (reduced sodium). Avocado. Safflower, olive, or canola oils. Natural peanut or almond butter. Other Unsalted popcorn and pretzels. The items listed above may not be a complete list of recommended foods or beverages. Contact your dietitian for more options. WHAT FOODS ARE NOT RECOMMENDED? Grains White bread. White pasta. White rice. Refined cornbread. Bagels and croissants. Crackers that contain trans fat. Vegetables Creamed or fried vegetables. Vegetables in a cheese sauce. Regular canned vegetables. Regular canned tomato sauce and paste. Regular tomato and vegetable juices. Fruits Dried fruits. Canned fruit in light or heavy syrup. Fruit juice. Meat and Other Protein Products Fatty cuts of meat. Ribs, chicken wings, bacon, sausage, bologna, salami, chitterlings, fatback, hot   dogs, bratwurst, and packaged luncheon meats. Salted nuts and seeds. Canned beans with salt. Dairy Whole or 2% milk, cream, half-and-half, and cream cheese. Whole-fat or sweetened yogurt. Full-fat cheeses or blue cheese. Nondairy creamers and whipped toppings. Processed cheese, cheese spreads, or cheese curds. Condiments Onion and garlic salt, seasoned salt, table salt, and sea salt. Canned and packaged gravies. Worcestershire sauce. Tartar sauce. Barbecue sauce.  Teriyaki sauce. Soy sauce, including reduced sodium. Steak sauce. Fish sauce. Oyster sauce. Cocktail sauce. Horseradish. Ketchup and mustard. Meat flavorings and tenderizers. Bouillon cubes. Hot sauce. Tabasco sauce. Marinades. Taco seasonings. Relishes. Fats and Oils Butter, stick margarine, lard, shortening, ghee, and bacon fat. Coconut, palm kernel, or palm oils. Regular salad dressings. Other Pickles and olives. Salted popcorn and pretzels. The items listed above may not be a complete list of foods and beverages to avoid. Contact your dietitian for more information. WHERE CAN I FIND MORE INFORMATION? National Heart, Lung, and Blood Institute: [employer].itwww.nhlbi.nih.gov/health/health-topics/topics/dash/ Document Released: 02/14/2011 Document Revised: 03/02/2013 Document Reviewed: 12/30/2012 [facility] Patient Information 2015 [place], [place]. This information is not intended to replace advice given to you by your health care provider. Make sure you discuss any questions you have with your health care provider. Fat and Cholesterol Control Diet Fat and cholesterol levels in your blood and organs are influenced by your diet. High levels of fat and cholesterol may lead to diseases of the heart, small and large blood vessels, gallbladder, liver, and pancreas. CONTROLLING FAT AND CHOLESTEROL WITH DIET Although exercise and lifestyle factors are important, your diet is key. That is because certain foods are known to raise cholesterol and others to lower it. The goal is to balance foods for their effect on cholesterol and more importantly, to replace saturated and trans fat with other types of fat, such as monounsaturated fat, polyunsaturated fat, and omega-3 fatty acids. On average, a person should consume no more than 15 to 17 g of saturated fat daily. Saturated and trans fats are considered "bad" fats, and they will raise LDL cholesterol. Saturated fats are primarily found in animal products such as meats, butter,  and cream. However, that does not mean you need to give up all your favorite foods. Today, there are good tasting, low-fat, low-cholesterol substitutes for most of the things you like to eat. Choose low-fat or nonfat alternatives. Choose round or loin cuts of red meat. These types of cuts are lowest in fat and cholesterol. Chicken (without the skin), fish, veal, and ground [place] breast are great choices. Eliminate fatty meats, such as hot dogs and salami. Even shellfish have little or no saturated fat. Have a 3 oz (85 g) portion when you eat lean meat, poultry, or fish. Trans fats are also called "partially hydrogenated oils." They are oils that have been scientifically manipulated so that they are solid at room temperature resulting in a longer shelf life and improved taste and texture of foods in which they are added. Trans fats are found in stick margarine, some tub margarines, cookies, crackers, and baked goods.  When baking and cooking, oils are a great substitute for butter. The monounsaturated oils are especially beneficial since it is believed they lower LDL and raise HDL. The oils you should avoid entirely are saturated tropical oils, such as coconut and palm.  Remember to eat a lot from food groups that are naturally free of saturated and trans fat, including fish, fruit, vegetables, beans, grains (barley, rice, couscous, bulgur wheat), and pasta (without cream sauces).  IDENTIFYING FOODS THAT  LOWER FAT AND CHOLESTEROL  Soluble fiber may lower your cholesterol. This type of fiber is found in fruits such as apples, vegetables such as broccoli, potatoes, and carrots, legumes such as beans, peas, and lentils, and grains such as barley. Foods fortified with plant sterols (phytosterol) may also lower cholesterol. You should eat at least 2 g per day of these foods for a cholesterol lowering effect.  Read package labels to identify low-saturated fats, trans fat free, and low-fat foods at the supermarket.  Select cheeses that have only 2 to 3 g saturated fat per ounce. Use a heart-healthy tub margarine that is free of trans fats or partially hydrogenated oil. When buying baked goods (cookies, crackers), avoid partially hydrogenated oils. Breads and muffins should be made from whole grains (whole-wheat or whole oat flour, instead of "flour" or "enriched flour"). Buy non-creamy canned soups with reduced salt and no added fats.  FOOD PREPARATION TECHNIQUES  Never deep-fry. If you must fry, either stir-fry, which uses very little fat, or use non-stick cooking sprays. When possible, broil, bake, or roast meats, and steam vegetables. Instead of putting butter or margarine on vegetables, use lemon and herbs, applesauce, and cinnamon (for squash and sweet potatoes). Use nonfat yogurt, salsa, and low-fat dressings for salads.  LOW-SATURATED FAT / LOW-FAT FOOD SUBSTITUTES Meats / Saturated Fat (g)  Avoid: Steak, marbled (3 oz/85 g) / 11 g  Choose: Steak, lean (3 oz/85 g) / 4 g  Avoid: Hamburger (3 oz/85 g) / 7 g  Choose: Hamburger, lean (3 oz/85 g) / 5 g  Avoid: Ham (3 oz/85 g) / 6 g  Choose: Ham, lean cut (3 oz/85 g) / 2.4 g  Avoid: Chicken, with skin, dark meat (3 oz/85 g) / 4 g  Choose: Chicken, skin removed, dark meat (3 oz/85 g) / 2 g  Avoid: Chicken, with skin, light meat (3 oz/85 g) / 2.5 g  Choose: Chicken, skin removed, light meat (3 oz/85 g) / 1 g Dairy / Saturated Fat (g)  Avoid: Whole milk (1 cup) / 5 g  Choose: Low-fat milk, 2% (1 cup) / 3 g  Choose: Low-fat milk, 1% (1 cup) / 1.5 g  Choose: Skim milk (1 cup) / 0.3 g  Avoid: Hard cheese (1 oz/28 g) / 6 g  Choose: Skim milk cheese (1 oz/28 g) / 2 to 3 g  Avoid: Cottage cheese, 4% fat (1 cup) / 6.5 g  Choose: Low-fat cottage cheese, 1% fat (1 cup) / 1.5 g  Avoid: Ice cream (1 cup) / 9 g  Choose: Sherbet (1 cup) / 2.5 g  Choose: Nonfat frozen yogurt (1 cup) / 0.3 g  Choose: Frozen fruit bar / trace  Avoid: Whipped  cream (1 tbs) / 3.5 g  Choose: Nondairy whipped topping (1 tbs) / 1 g Condiments / Saturated Fat (g)  Avoid: Mayonnaise (1 tbs) / 2 g  Choose: Low-fat mayonnaise (1 tbs) / 1 g  Avoid: Butter (1 tbs) / 7 g  Choose: Extra light margarine (1 tbs) / 1 g  Avoid: Coconut oil (1 tbs) / 11.8 g  Choose: Olive oil (1 tbs) / 1.8 g  Choose: Corn oil (1 tbs) / 1.7 g  Choose: Safflower oil (1 tbs) / 1.2 g  Choose: Sunflower oil (1 tbs) / 1.4 g  Choose: Soybean oil (1 tbs) / 2.4 g  Choose: Canola oil (1 tbs) / 1 g Document Released: 02/25/2005 Document Revised: 06/22/2012 Document Reviewed: 08/16/2010 Exit[employer] Patient Information 2015 [place],  [employer]. This information is not intended to replace advice given to you by your health care provider. Make sure you discuss any questions you have with your health care provider.  

## 2013-09-24 NOTE — Progress Notes (Signed)
Patient here for follow up on her hypertension 

## 2013-09-25 LAB — HEPATITIS C ANTIBODY: HCV AB: NEGATIVE

## 2013-09-25 LAB — HEPATITIS B SURFACE ANTIBODY,QUALITATIVE: Hep B S Ab: NEGATIVE

## 2013-09-25 LAB — HEPATITIS B SURFACE ANTIGEN: HEP B S AG: NEGATIVE

## 2013-09-27 ENCOUNTER — Telehealth: Payer: Self-pay | Admitting: *Deleted

## 2013-09-27 NOTE — Telephone Encounter (Signed)
Left message for patient to return call to discuss lab results.  

## 2013-09-27 NOTE — Telephone Encounter (Signed)
Message copied by Fredderick SeveranceUCATTE, LAURENZE L on Mon Sep 27, 2013  5:47 PM ------      Message from: Doris CheadleADVANI, DEEPAK      Created: Mon Sep 27, 2013 12:24 PM       Call and let the patient know that her hepatitis panel is negative. ------

## 2013-09-30 ENCOUNTER — Telehealth: Payer: Self-pay | Admitting: *Deleted

## 2013-09-30 NOTE — Telephone Encounter (Signed)
Patient notified that all labs are negative.   Notes Recorded by Doris Cheadleeepak Advani, MD on 09/27/2013 at 12:24 PM Call and let the patient know that her hepatitis panel is negative.

## 2013-09-30 NOTE — Telephone Encounter (Signed)
Pt returning call regarding results. Please f/u with pt. °

## 2014-03-21 ENCOUNTER — Telehealth: Payer: Self-pay | Admitting: Internal Medicine

## 2014-03-21 NOTE — Telephone Encounter (Signed)
Patient presents to clinic to drop off paperwork for disability parking placard. Patient has scheduled OV on 03/29/14 and would like to pick up the paperwork at that time. Patient informed that provider has up to 14 days, from the date that paperwork is dropped off, to complete. Patient verbalizes understanding. Paperwork placed in providers box. Thanks

## 2014-03-29 ENCOUNTER — Ambulatory Visit: Payer: PRIVATE HEALTH INSURANCE | Attending: Internal Medicine | Admitting: Internal Medicine

## 2014-03-29 ENCOUNTER — Encounter: Payer: Self-pay | Admitting: Internal Medicine

## 2014-03-29 VITALS — BP 138/91 | HR 102 | Temp 98.0°F | Resp 16 | Wt 313.0 lb

## 2014-03-29 DIAGNOSIS — K219 Gastro-esophageal reflux disease without esophagitis: Secondary | ICD-10-CM | POA: Insufficient documentation

## 2014-03-29 DIAGNOSIS — E7800 Pure hypercholesterolemia, unspecified: Secondary | ICD-10-CM

## 2014-03-29 DIAGNOSIS — M25561 Pain in right knee: Secondary | ICD-10-CM | POA: Insufficient documentation

## 2014-03-29 DIAGNOSIS — M25562 Pain in left knee: Secondary | ICD-10-CM | POA: Insufficient documentation

## 2014-03-29 DIAGNOSIS — Z23 Encounter for immunization: Secondary | ICD-10-CM | POA: Insufficient documentation

## 2014-03-29 DIAGNOSIS — F329 Major depressive disorder, single episode, unspecified: Secondary | ICD-10-CM

## 2014-03-29 DIAGNOSIS — R7989 Other specified abnormal findings of blood chemistry: Secondary | ICD-10-CM

## 2014-03-29 DIAGNOSIS — R21 Rash and other nonspecific skin eruption: Secondary | ICD-10-CM | POA: Insufficient documentation

## 2014-03-29 DIAGNOSIS — F32A Depression, unspecified: Secondary | ICD-10-CM

## 2014-03-29 DIAGNOSIS — R945 Abnormal results of liver function studies: Secondary | ICD-10-CM | POA: Insufficient documentation

## 2014-03-29 DIAGNOSIS — I10 Essential (primary) hypertension: Secondary | ICD-10-CM

## 2014-03-29 DIAGNOSIS — Z1231 Encounter for screening mammogram for malignant neoplasm of breast: Secondary | ICD-10-CM

## 2014-03-29 LAB — COMPLETE METABOLIC PANEL WITH GFR
ALBUMIN: 4.3 g/dL (ref 3.5–5.2)
ALK PHOS: 100 U/L (ref 39–117)
ALT: 75 U/L — AB (ref 0–35)
AST: 59 U/L — ABNORMAL HIGH (ref 0–37)
BUN: 16 mg/dL (ref 6–23)
CALCIUM: 9.8 mg/dL (ref 8.4–10.5)
CHLORIDE: 101 meq/L (ref 96–112)
CO2: 27 mEq/L (ref 19–32)
CREATININE: 0.82 mg/dL (ref 0.50–1.10)
GFR, EST NON AFRICAN AMERICAN: 83 mL/min
GFR, Est African American: >89 mL/min
Glucose, Bld: 108 mg/dL — ABNORMAL HIGH (ref 70–99)
POTASSIUM: 4.3 meq/L (ref 3.5–5.3)
Sodium: 139 mEq/L (ref 135–145)
TOTAL PROTEIN: 7.7 g/dL (ref 6.0–8.3)
Total Bilirubin: 0.3 mg/dL (ref 0.2–1.2)

## 2014-03-29 MED ORDER — KETOCONAZOLE 2 % EX CREA
1.0000 | TOPICAL_CREAM | Freq: Every day | CUTANEOUS | Status: AC
Start: 2014-03-29 — End: ?

## 2014-03-29 MED ORDER — ESOMEPRAZOLE MAGNESIUM 40 MG PO CPDR: 40.0000 mg | DELAYED_RELEASE_CAPSULE | Freq: Every day | ORAL | Status: AC

## 2014-03-29 MED ORDER — METOPROLOL SUCCINATE ER 50 MG PO TB24: 50.0000 mg | ORAL_TABLET | Freq: Every day | ORAL | Status: AC

## 2014-03-29 MED ORDER — FLUOXETINE HCL 40 MG PO CAPS: 40.0000 mg | ORAL_CAPSULE | Freq: Every day | ORAL | Status: AC

## 2014-03-29 MED ORDER — ATORVASTATIN CALCIUM 40 MG PO TABS: 40.0000 mg | ORAL_TABLET | Freq: Every day | ORAL | Status: AC

## 2014-03-29 NOTE — Patient Instructions (Signed)
DASH Eating Plan °DASH stands for "Dietary Approaches to Stop Hypertension." The DASH eating plan is a healthy eating plan that has been shown to reduce high blood pressure (hypertension). Additional health benefits may include reducing the risk of type 2 diabetes mellitus, heart disease, and stroke. The DASH eating plan may also help with weight loss. °WHAT DO I NEED TO KNOW ABOUT THE DASH EATING PLAN? °For the DASH eating plan, you will follow these general guidelines: °· Choose foods with a percent daily value for sodium of less than 5% (as listed on the food label). °· Use salt-free seasonings or herbs instead of table salt or sea salt. °· Check with your health care provider or pharmacist before using salt substitutes. °· Eat lower-sodium products, often labeled as "lower sodium" or "no salt added." °· Eat fresh foods. °· Eat more vegetables, fruits, and low-fat dairy products. °· Choose whole grains. Look for the word "whole" as the first word in the ingredient list. °· Choose fish and skinless chicken or turkey more often than red meat. Limit fish, poultry, and meat to 6 oz (170 g) each day. °· Limit sweets, desserts, sugars, and sugary drinks. °· Choose heart-healthy fats. °· Limit cheese to 1 oz (28 g) per day. °· Eat more home-cooked food and less restaurant, buffet, and fast food. °· Limit fried foods. °· Cook foods using methods other than frying. °· Limit canned vegetables. If you do use them, rinse them well to decrease the sodium. °· When eating at a restaurant, ask that your food be prepared with less salt, or no salt if possible. °WHAT FOODS CAN I EAT? °Seek help from a dietitian for individual calorie needs. °Grains °Whole grain or whole wheat bread. Brown rice. Whole grain or whole wheat pasta. Quinoa, bulgur, and whole grain cereals. Low-sodium cereals. Corn or whole wheat flour tortillas. Whole grain cornbread. Whole grain crackers. Low-sodium crackers. °Vegetables °Fresh or frozen vegetables  (raw, steamed, roasted, or grilled). Low-sodium or reduced-sodium tomato and vegetable juices. Low-sodium or reduced-sodium tomato sauce and paste. Low-sodium or reduced-sodium canned vegetables.  °Fruits °All fresh, canned (in natural juice), or frozen fruits. °Meat and Other Protein Products °Ground beef (85% or leaner), grass-fed beef, or beef trimmed of fat. Skinless chicken or turkey. Ground chicken or turkey. Pork trimmed of fat. All fish and seafood. Eggs. Dried beans, peas, or lentils. Unsalted nuts and seeds. Unsalted canned beans. °Dairy °Low-fat dairy products, such as skim or 1% milk, 2% or reduced-fat cheeses, low-fat ricotta or cottage cheese, or plain low-fat yogurt. Low-sodium or reduced-sodium cheeses. °Fats and Oils °Tub margarines without trans fats. Light or reduced-fat mayonnaise and salad dressings (reduced sodium). Avocado. Safflower, olive, or canola oils. Natural peanut or almond butter. °Other °Unsalted popcorn and pretzels. °The items listed above may not be a complete list of recommended foods or beverages. Contact your dietitian for more options. °WHAT FOODS ARE NOT RECOMMENDED? °Grains °White bread. White pasta. White rice. Refined cornbread. Bagels and croissants. Crackers that contain trans fat. °Vegetables °Creamed or fried vegetables. Vegetables in a cheese sauce. Regular canned vegetables. Regular canned tomato sauce and paste. Regular tomato and vegetable juices. °Fruits °Dried fruits. Canned fruit in light or heavy syrup. Fruit juice. °Meat and Other Protein Products °Fatty cuts of meat. Ribs, chicken wings, bacon, sausage, bologna, salami, chitterlings, fatback, hot dogs, bratwurst, and packaged luncheon meats. Salted nuts and seeds. Canned beans with salt. °Dairy °Whole or 2% milk, cream, half-and-half, and cream cheese. Whole-fat or sweetened yogurt. Full-fat   cheeses or blue cheese. Nondairy creamers and whipped toppings. Processed cheese, cheese spreads, or cheese  curds. °Condiments °Onion and garlic salt, seasoned salt, table salt, and sea salt. Canned and packaged gravies. Worcestershire sauce. Tartar sauce. Barbecue sauce. Teriyaki sauce. Soy sauce, including reduced sodium. Steak sauce. Fish sauce. Oyster sauce. Cocktail sauce. Horseradish. Ketchup and mustard. Meat flavorings and tenderizers. Bouillon cubes. Hot sauce. Tabasco sauce. Marinades. Taco seasonings. Relishes. °Fats and Oils °Butter, stick margarine, lard, shortening, ghee, and bacon fat. Coconut, palm kernel, or palm oils. Regular salad dressings. °Other °Pickles and olives. Salted popcorn and pretzels. °The items listed above may not be a complete list of foods and beverages to avoid. Contact your dietitian for more information. °WHERE CAN I FIND MORE INFORMATION? °[employer], [employer], [employer]: www.nhlbi.nih.gov/health/health-topics/topics/dash/ °Document Released: 02/14/2011 Document Revised: 07/12/2013 Document Reviewed: 12/30/2012 °Exit[employer]® Patient Information ©2015 [employer]. This information is not intended to replace advice given to you by your health care provider. Make sure you discuss any questions you have with your health care provider. ° °

## 2014-03-29 NOTE — Progress Notes (Signed)
Patient complains of bilateral knee pain Knees are so painful she has trouble sleeping Complains of having headaches and some nausea the past three months Requesting medications to be sent to a [facility] [place] to [place]

## 2014-03-29 NOTE — Progress Notes (Signed)
MRN: [id number] Name: Julie Fuller  Sex: female Age: 54 y.o. DOB: [birth year]  Allergies: Erythromycin; Other; and Phenytoin  Chief Complaint  Patient presents with  . Knee Pain    HPI: Patient is 54 y.o. female who history of hypertension, GERD her, arthritis,depression, hyperlipidemia she comes today for followup, she reported to have worsening bilateral knee pain as per patient she was seen orthopedics in the past, she denies any recent fall or trauma, her previous blood work reviewed noticed abnormal LFTs, subsequent hepatitis panel was negative, she denies drinking alcohol, today she's requesting refill on her medications she ran out of of her blood pressure medication, the blood pressure is borderline elevated denies any headache dizziness chest and shortness of breath.  Past Medical History  Diagnosis Date  . Asthma   . Hypercholesteremia   . Migraine   . DJD (degenerative joint disease), cervical   . Seizures     Past Surgical History  Procedure Laterality Date  . Cholecystectomy    . Abdominal hysterectomy    . Hernia repair    . Carpal tunnel release        Medication List       This list is accurate as of: 03/29/14  5:47 PM.  Always use your most recent med list.               albuterol (2.5 MG/3ML) 0.083% nebulizer solution  Commonly known as:  PROVENTIL  Take 2.5 mg by nebulization every 6 (six) hours as needed. Wheezing and shortness of breath     albuterol 108 (90 BASE) MCG/ACT inhaler  Commonly known as:  PROVENTIL HFA;VENTOLIN HFA  Inhale 2 puffs into the lungs every 6 (six) hours as needed. For shortness of breath     atorvastatin 40 MG tablet  Commonly known as:  LIPITOR  Take 1 tablet (40 mg total) by mouth at bedtime.     benzonatate 200 MG capsule  Commonly known as:  TESSALON  Take 1 capsule (200 mg total) by mouth 3 (three) times daily as needed for cough.     chlorpheniramine-HYDROcodone 10-8 MG/5ML Lqcr  Commonly known as:   TUSSIONEX  Take 5 mLs by mouth every 12 (twelve) hours as needed.     esomeprazole 40 MG capsule  Commonly known as:  NEXIUM  Take 1 capsule (40 mg total) by mouth at bedtime.     FLUoxetine 40 MG capsule  Commonly known as:  PROZAC  Take 1 capsule (40 mg total) by mouth daily.     ketoconazole 2 % cream  Commonly known as:  NIZORAL  Apply 1 application topically daily.     LORazepam 0.5 MG tablet  Commonly known as:  ATIVAN  Take 0.5 mg by mouth every 8 (eight) hours as needed. For anxiety     metoprolol succinate 50 MG 24 hr tablet  Commonly known as:  TOPROL-XL  Take 1 tablet (50 mg total) by mouth daily. Take with or immediately following a meal.     SUMAtriptan 6 MG/0.5ML Soln injection  Commonly known as:  IMITREX  Inject 6 mg into the skin every 2 (two) hours as needed. Migraine     zolpidem 10 MG tablet  Commonly known as:  AMBIEN  Take 10 mg by mouth at bedtime as needed. sleep        Meds ordered this encounter  Medications  . atorvastatin (LIPITOR) 40 MG tablet    Sig: Take 1 tablet (40 mg total)  by mouth at bedtime.    Dispense:  30 tablet    Refill:  3  . esomeprazole (NEXIUM) 40 MG capsule    Sig: Take 1 capsule (40 mg total) by mouth at bedtime.    Dispense:  90 capsule    Refill:  3  . FLUoxetine (PROZAC) 40 MG capsule    Sig: Take 1 capsule (40 mg total) by mouth daily.    Dispense:  30 capsule    Refill:  3  . metoprolol succinate (TOPROL-XL) 50 MG 24 hr tablet    Sig: Take 1 tablet (50 mg total) by mouth daily. Take with or immediately following a meal.    Dispense:  30 tablet    Refill:  3  . ketoconazole (NIZORAL) 2 % cream    Sig: Apply 1 application topically daily.    Dispense:  15 g    Refill:  2    Immunization History  Administered Date(s) Administered  . Influenza,inj,Quad PF,36+ Mos 03/29/2014    Family History  Problem Relation Age of Onset  . Hypertension Mother   . Diabetes Mother   . Hypertension Sister   . Cancer  Son     melanoma     History  Substance Use Topics  . Smoking status: Never Smoker   . Smokeless tobacco: Not on file  . Alcohol Use: No    Review of Systems   As noted in HPI  Filed Vitals:   03/29/14 1713  BP: 138/91  Pulse: 102  Temp: 98 F (36.7 C)  Resp: 16    Physical Exam  Physical Exam  Constitutional:  Obese female sitting comfortably not in acute distress.  Eyes: EOM are normal. Pupils are equal, round, and reactive to light.  Cardiovascular: Regular rhythm.   No murmur heard. tachycardic  Pulmonary/Chest: No respiratory distress. She has no wheezes. She has no rales.  Musculoskeletal:  Bilateral knee crepitation+    CBC    Component Value Date/Time   WBC 6.8 08/17/2013 0959   RBC 4.98 08/17/2013 0959   HGB 12.8 08/17/2013 0959   HCT 39.0 08/17/2013 0959   PLT 317 08/17/2013 0959   MCV 78.3 08/17/2013 0959   LYMPHSABS 2.7 08/17/2013 0959   MONOABS 0.3 08/17/2013 0959   EOSABS 0.0 08/17/2013 0959   BASOSABS 0.0 08/17/2013 0959    CMP     Component Value Date/Time   NA 139 08/17/2013 0959   K 4.9 08/17/2013 0959   CL 103 08/17/2013 0959   CO2 27 08/17/2013 0959   GLUCOSE 135* 08/17/2013 0959   BUN 13 08/17/2013 0959   CREATININE 0.69 08/17/2013 0959   CREATININE 0.50 03/27/2011 0925   CALCIUM 9.9 08/17/2013 0959   PROT 6.7 08/17/2013 0959   ALBUMIN 4.2 08/17/2013 0959   AST 62* 08/17/2013 0959   ALT 126* 08/17/2013 0959   ALKPHOS 137* 08/17/2013 0959   BILITOT 0.3 08/17/2013 0959   GFRNONAA >89 08/17/2013 0959   GFRNONAA >90 03/27/2011 0925   GFRAA >89 08/17/2013 0959   GFRAA >90 03/27/2011 0925    Lab Results  Component Value Date/Time   CHOL 263* 08/17/2013 09:59 AM    No components found for: HGA1C  Lab Results  Component Value Date/Time   AST 62* 08/17/2013 09:59 AM    Assessment and Plan  Essential hypertension, benign - Plan:advised patient for DASH diet, resume back on  metoprolol succinate (TOPROL-XL) 50 MG 24  hr tablet, COMPLETE METABOLIC PANEL WITH GFR  High cholesterol -  Plan: currently patient is on atorvastatin (LIPITOR) 40 MG tablet, will repeat fasting lipid panel on the next visit.  Depression - Plan: FLUoxetine (PROZAC) 40 MG capsule  Gastroesophageal reflux disease without esophagitis Past modification, continue with Nexium  Abnormal LFTs Will repeat LFTs, hepatitis panel was negative.  Rash and nonspecific skin eruption - Plan: ketoconazole (NIZORAL) 2 % cream  Encounter for screening mammogram for breast cancer - Plan: MM DIGITAL SCREENING BILATERAL  Needs flu shot Flu shot given today.  Bilateral knee pain - Plan: DG Knee Bilateral Standing AP   Health Maintenance  -Mammogram:ordered -Vaccinations: Flu shot given today.   Return in about 3 months (around 06/28/2014) for hypertension, hyperipidemia.  Doris Cheadle, MD

## 2014-04-04 ENCOUNTER — Telehealth: Payer: Self-pay | Admitting: *Deleted

## 2014-04-04 NOTE — Telephone Encounter (Signed)
-----   Message from Doris Cheadleeepak Advani, MD sent at 03/30/2014  9:40 AM EST ----- Call and let the patient know that her LFTs is  Improving

## 2014-04-04 NOTE — Telephone Encounter (Signed)
Left voice message to return call 

## 2014-04-08 ENCOUNTER — Other Ambulatory Visit: Payer: Self-pay

## 2014-04-08 NOTE — Progress Notes (Unsigned)
Patient's application for parking placard filled out and signed Copy made for epic to be scanned and is at the front desk to be picked up

## 2014-05-14 ENCOUNTER — Encounter: Payer: Self-pay | Admitting: Gastroenterology

## 2014-07-29 ENCOUNTER — Other Ambulatory Visit: Payer: Self-pay | Admitting: Internal Medicine

## 2014-07-29 ENCOUNTER — Telehealth: Payer: Self-pay | Admitting: Internal Medicine

## 2014-07-29 NOTE — Telephone Encounter (Signed)
Patient called requesting medication refill on albuterol (PROVENTIL HFA;VENTOLIN HFA) 108 (90 BASE) MCG/ACT inhaler. [facility].Please f/u

## 2014-08-25 ENCOUNTER — Ambulatory Visit: Payer: Self-pay

## 2014-09-20 NOTE — Telephone Encounter (Signed)
Pt meds have been called into the pharmacy
